# Patient Record
Sex: Female | Born: 1990 | Race: Black or African American | Hispanic: No | Marital: Single | Smoking: Never smoker
Health system: Southern US, Community
[De-identification: ages and names within clinical notes are randomized; demographics above are authoritative.]

## PROBLEM LIST (undated history)

## (undated) DIAGNOSIS — G4730 Sleep apnea, unspecified: Secondary | ICD-10-CM

## (undated) DIAGNOSIS — M109 Gout, unspecified: Secondary | ICD-10-CM

## (undated) DIAGNOSIS — J45909 Unspecified asthma, uncomplicated: Secondary | ICD-10-CM

## (undated) HISTORY — PX: CYST EXCISION: SHX5701

## (undated) HISTORY — PX: TONSILLECTOMY: SUR1361

---

## 2019-01-26 DIAGNOSIS — J45909 Unspecified asthma, uncomplicated: Secondary | ICD-10-CM | POA: Insufficient documentation

## 2020-08-10 ENCOUNTER — Other Ambulatory Visit: Payer: Self-pay

## 2020-08-10 ENCOUNTER — Encounter (HOSPITAL_COMMUNITY): Payer: Self-pay | Admitting: Emergency Medicine

## 2020-08-10 DIAGNOSIS — N3000 Acute cystitis without hematuria: Secondary | ICD-10-CM | POA: Diagnosis not present

## 2020-08-10 DIAGNOSIS — R10819 Abdominal tenderness, unspecified site: Secondary | ICD-10-CM | POA: Insufficient documentation

## 2020-08-10 DIAGNOSIS — M545 Low back pain: Secondary | ICD-10-CM | POA: Diagnosis present

## 2020-08-10 DIAGNOSIS — J45909 Unspecified asthma, uncomplicated: Secondary | ICD-10-CM | POA: Diagnosis not present

## 2020-08-10 LAB — URINALYSIS, ROUTINE W REFLEX MICROSCOPIC
Bilirubin Urine: NEGATIVE
Glucose, UA: NEGATIVE mg/dL
Ketones, ur: NEGATIVE mg/dL
Nitrite: NEGATIVE
Protein, ur: NEGATIVE mg/dL
Specific Gravity, Urine: 1.034 — ABNORMAL HIGH (ref 1.005–1.030)
pH: 5 (ref 5.0–8.0)

## 2020-08-10 LAB — CBC
HCT: 37.6 % (ref 36.0–46.0)
Hemoglobin: 12.1 g/dL (ref 12.0–15.0)
MCH: 28.5 pg (ref 26.0–34.0)
MCHC: 32.2 g/dL (ref 30.0–36.0)
MCV: 88.7 fL (ref 80.0–100.0)
Platelets: 321 10*3/uL (ref 150–400)
RBC: 4.24 MIL/uL (ref 3.87–5.11)
RDW: 14.6 % (ref 11.5–15.5)
WBC: 8.9 10*3/uL (ref 4.0–10.5)
nRBC: 0 % (ref 0.0–0.2)

## 2020-08-10 LAB — HCG, QUANTITATIVE, PREGNANCY: hCG, Beta Chain, Quant, S: <1 m[IU]/mL (ref <5)

## 2020-08-10 LAB — BASIC METABOLIC PANEL
Anion gap: 9 (ref 5–15)
BUN: 15 mg/dL (ref 6–20)
CO2: 23 mmol/L (ref 22–32)
Calcium: 8.7 mg/dL — ABNORMAL LOW (ref 8.9–10.3)
Chloride: 107 mmol/L (ref 98–111)
Creatinine, Ser: 0.71 mg/dL (ref 0.44–1.00)
GFR calc Af Amer: >60 mL/min (ref >60)
GFR calc non Af Amer: >60 mL/min (ref >60)
Glucose, Bld: 113 mg/dL — ABNORMAL HIGH (ref 70–99)
Potassium: 4.2 mmol/L (ref 3.5–5.1)
Sodium: 139 mmol/L (ref 135–145)

## 2020-08-10 NOTE — ED Triage Notes (Signed)
Patient is complaining of back pain but hurt worse on the lower part of her back. Patient states this started on Sunday.

## 2020-08-11 ENCOUNTER — Emergency Department (HOSPITAL_COMMUNITY)
Admission: EM | Admit: 2020-08-11 | Discharge: 2020-08-11 | Disposition: A | Discharge status: Home / Self Care | Payer: Medicaid Other | Attending: Emergency Medicine | Admitting: Emergency Medicine

## 2020-08-11 DIAGNOSIS — N3000 Acute cystitis without hematuria: Secondary | ICD-10-CM

## 2020-08-11 HISTORY — DX: Sleep apnea, unspecified: G47.30

## 2020-08-11 HISTORY — DX: Gout, unspecified: M10.9

## 2020-08-11 HISTORY — DX: Unspecified asthma, uncomplicated: J45.909

## 2020-08-11 LAB — HEPATIC FUNCTION PANEL
ALT: 19 U/L (ref 0–44)
AST: 24 U/L (ref 15–41)
Albumin: 3.9 g/dL (ref 3.5–5.0)
Alkaline Phosphatase: 71 U/L (ref 38–126)
Bilirubin, Direct: 0.1 mg/dL (ref 0.0–0.2)
Indirect Bilirubin: 0 mg/dL — ABNORMAL LOW (ref 0.3–0.9)
Total Bilirubin: 0.1 mg/dL — ABNORMAL LOW (ref 0.3–1.2)
Total Protein: 7 g/dL (ref 6.5–8.1)

## 2020-08-11 LAB — CK: Total CK: 105 U/L (ref 38–234)

## 2020-08-11 LAB — LIPASE, BLOOD: Lipase: 28 U/L (ref 11–51)

## 2020-08-11 MED ORDER — MORPHINE SULFATE (PF) 4 MG/ML IV SOLN
4.0000 mg | Freq: Once | INTRAVENOUS | Status: AC
  Administered 2020-08-11: 4 mg via INTRAVENOUS
  Filled 2020-08-11: qty 1

## 2020-08-11 MED ORDER — ONDANSETRON HCL 4 MG/2ML IJ SOLN
4.0000 mg | Freq: Once | INTRAMUSCULAR | Status: AC
  Administered 2020-08-11: 4 mg via INTRAVENOUS
  Filled 2020-08-11: qty 2

## 2020-08-11 MED ORDER — PHENAZOPYRIDINE HCL 200 MG PO TABS
200.0000 mg | ORAL_TABLET | Freq: Three times a day (TID) | ORAL | 0 refills | Status: DC
Start: 2020-08-11 — End: 2021-04-04

## 2020-08-11 MED ORDER — CEPHALEXIN 500 MG PO CAPS: 500.0000 mg | ORAL_CAPSULE | Freq: Four times a day (QID) | ORAL | 0 refills | Status: AC

## 2020-08-11 NOTE — ED Provider Notes (Signed)
[facility] DEPT Provider Note   CSN: [id number] Arrival date & time: 08/10/20  2113     History Chief Complaint  Patient presents with  . Back Pain    Carly Perez is a 30 y.o. female with a history of gout, OSA, uterine fibroids, and asthma who presents to the emergency department with a chief complaint of back pain.  The patient endorses bilateral back pain that began 4 days ago.  States that the pain is most intense in her bilateral lower back, but radiates up her bilateral back into her shoulders and around her bilateral lower abdomen.  Pain is worse with standing.  There is some improvement with sitting.  She is unable to characterize the pain.  No history of similar.  She has been treating her symptoms with NSAIDs and muscle relaxers with no improvement in her symptoms.  No recent falls or injuries.  No fever, chills, vomiting, diarrhea, constipation, dysuria, hematuria, cough, shortness of breath, chest pain, vaginal discharge, pain, or bleeding.  LMP was last week.  She is sexually active with one female partner, her wife.  No concerns for STIs.  No history of cancer.  No IV drug use.  No urinary or fecal incontinence, numbness, weakness.  No difficulty walking.  The history is provided by the patient. No language interpreter was used.       Past Medical History:  Diagnosis Date  . Asthma   . Gout   . Sleep apnea     There are no problems to display for this patient.   History reviewed. No pertinent surgical history.   OB History   No obstetric history on file.     History reviewed. No pertinent family history.  Social History   Tobacco Use  . Smoking status: Never Smoker  . Smokeless tobacco: Never Used  Vaping Use  . Vaping Use: Never used  Substance Use Topics  . Alcohol use: Never  . Drug use: Never    Home Medications Prior to Admission medications   Medication Sig Start Date End Date Taking? Authorizing  Provider  cephALEXin (KEFLEX) 500 MG capsule Take 1 capsule (500 mg total) by mouth 4 (four) times daily for 5 days. 08/11/20 08/16/20  Shravan Salahuddin A, PA-C  phenazopyridine (PYRIDIUM) 200 MG tablet Take 1 tablet (200 mg total) by mouth 3 (three) times daily. 08/11/20   Amalee Olsen A, PA-C    Allergies    Dog epithelium allergy skin test, Other, and Shellfish-derived products  Review of Systems   Review of Systems  Constitutional: Negative for activity change, chills and fever.  HENT: Negative for congestion and sore throat.   Respiratory: Negative for cough, shortness of breath and wheezing.   Cardiovascular: Negative for chest pain, palpitations and leg swelling.  Gastrointestinal: Positive for abdominal pain and nausea. Negative for blood in stool, constipation, diarrhea and vomiting.  Genitourinary: Negative for difficulty urinating, dysuria, enuresis, flank pain, frequency, hematuria, pelvic pain and urgency.  Musculoskeletal: Positive for back pain. Negative for myalgias, neck pain and neck stiffness.  Skin: Negative for rash.  Allergic/Immunologic: Negative for immunocompromised state.  Neurological: Negative for dizziness, seizures, syncope, weakness, numbness and headaches.  Psychiatric/Behavioral: Negative for confusion.    Physical Exam Updated Vital Signs BP 114/83   Pulse 73   Temp 98.4 F (36.9 C) (Oral)   Resp 17   Ht 5\' 3"  (1.6 m)   Wt 104.3 kg   SpO2 96%   BMI 40.74 kg/m  Physical Exam Vitals and nursing note reviewed.  Constitutional:      General: She is not in acute distress.    Appearance: She is not ill-appearing, toxic-appearing or diaphoretic.  HENT:     Head: Normocephalic.  Eyes:     Conjunctiva/sclera: Conjunctivae normal.  Neck:     Comments: Full active and passive range of motion of the neck.  No meningismus. Cardiovascular:     Rate and Rhythm: Normal rate and regular rhythm.     Heart sounds: No murmur heard.  No friction rub. No  gallop.   Pulmonary:     Effort: Pulmonary effort is normal. No respiratory distress.     Breath sounds: No stridor. No wheezing, rhonchi or rales.     Comments: Lungs are clear to auscultation bilaterally.  No increased work of breathing. Chest:     Chest wall: No tenderness.  Abdominal:     General: There is no distension.     Palpations: Abdomen is soft. There is no mass.     Tenderness: There is abdominal tenderness. There is no right CVA tenderness, left CVA tenderness, guarding or rebound.     Hernia: No hernia is present.     Comments: Abdomen is soft and nondistended.  She is tender to palpation in the bilateral lower abdomen and right upper quadrant.  No left upper quadrant tenderness.  Negative Murphy sign.  No tenderness over McBurney's point.  Musculoskeletal:     Cervical back: Neck supple.     Comments: No tenderness to the cervical, thoracic, or lumbar spinous processes or bilateral paraspinal muscles.  There is tenderness diffusely to the musculature of the lumbar spine bilaterally.  No overlying rashes, erythema, or edema.  Skin:    General: Skin is warm.     Findings: No rash.  Neurological:     Mental Status: She is alert.  Psychiatric:        Behavior: Behavior normal.     ED Results / Procedures / Treatments   Labs (all labs ordered are listed, but only abnormal results are displayed) Labs Reviewed  URINALYSIS, ROUTINE W REFLEX MICROSCOPIC - Abnormal; Notable for the following components:      Result Value   Specific Gravity, Urine 1.034 (*)    Hgb urine dipstick MODERATE (*)    Leukocytes,Ua SMALL (*)    Bacteria, UA RARE (*)    All other components within normal limits  BASIC METABOLIC PANEL - Abnormal; Notable for the following components:   Glucose, Bld 113 (*)    Calcium 8.7 (*)    All other components within normal limits  HEPATIC FUNCTION PANEL - Abnormal; Notable for the following components:   Total Bilirubin 0.1 (*)    Indirect Bilirubin 0.0  (*)    All other components within normal limits  URINE CULTURE  HCG, QUANTITATIVE, PREGNANCY  CBC  LIPASE, BLOOD  CK    EKG None  Radiology No results found.  Procedures Procedures (including critical care time)  Medications Ordered in ED Medications  morphine 4 MG/ML injection 4 mg (4 mg Intravenous Given 08/11/20 0317)  ondansetron (ZOFRAN) injection 4 mg (4 mg Intravenous Given 08/11/20 2671)    ED Course  I have reviewed the triage vital signs and the nursing notes.  Pertinent labs & imaging results that were available during my care of the patient were reviewed by me and considered in my medical decision making (see chart for details).    MDM Rules/Calculators/A&P  29 year old female with a history of gout, OSA, uterine fibroids, and asthma presenting with lower abdominal and back pain for the last few days.  No constitutional symptoms.  No GI or GU symptoms.  Vital signs are normal in the ER.  On exam, she is tender to palpation in the bilateral lower abdomen and back.  No focal tenderness over the spine.  She has no neurologic deficits.  Pain managed in the ER.  She has no metabolic derangements.  No leukocytosis.  UA with moderate hemoglobinuria and leukocyte Esterace.  Urine culture sent.  Has no concerns for pregnancy or GU complaints at this time and declines pelvic exam.  Her abdomen is soft and nondistended.  She does not have a surgical abdomen.  Further imaging is not indicated at this time.  On reevaluation, her pain is much improved.  Will discharge home with Keflex and Pyridium for UTI.  Recommended OTC pain medication.  She is also been advised to get established with a PCP since she recently relocated to the area.  I have a low suspicion for pyelonephritis, diverticulitis, PID, cholecystitis, ovarian torsion, or appendicitis.  All questions answered. She is hemodynamically stable in no acute distress.  Safe for discharge home with  outpatient follow-up as indicated.  Final Clinical Impression(s) / ED Diagnoses Final diagnoses:  Acute cystitis without hematuria    Rx / DC Orders ED Discharge Orders         Ordered    phenazopyridine (PYRIDIUM) 200 MG tablet  3 times daily     Discontinue  Reprint     08/11/20 0440    cephALEXin (KEFLEX) 500 MG capsule  4 times daily     Discontinue  Reprint     08/11/20 0440           Joanne Gavel, PA-C 08/12/20 9233    Margette Fast, MD 08/12/20 [phone]

## 2020-08-11 NOTE — Discharge Instructions (Signed)
Thank you for allowing me to care for you today in the Emergency Department.   You were seen in the emergency department for back and abdominal pain.  Your work-up was concerning for a urinary tract infection.  Take 1 tablet of Keflex every 6 hours for the next 5 days.  Take Pyridium as prescribed to help with your symptoms. Take 650 mg of Tylenol or 600 mg of ibuprofen with food every 6 hours for pain.  You can alternate between these 2 medications every 3 hours if your pain returns.  For instance, you can take Tylenol at noon, followed by a dose of ibuprofen at 3, followed by second dose of Tylenol and 6.  Call the number on your discharge paperwork to get established with a primary care clinician for follow-up if your symptoms do not improve with the regimen above.  Return to the emergency department if you become unable to walk, if you develop high fevers, uncontrollable vomiting, new numbness or weakness, or other new, concerning symptoms.

## 2020-08-12 LAB — URINE CULTURE: Culture: 40000 — AB

## 2020-08-14 ENCOUNTER — Telehealth: Payer: Self-pay | Admitting: Emergency Medicine

## 2020-08-14 NOTE — Telephone Encounter (Signed)
Post ED Visit - Positive Culture Follow-up  Culture report reviewed by antimicrobial stewardship pharmacist: [facility] Team []  Elenor Quinones, Pharm.D. []  Heide Guile, Pharm.D., BCPS AQ-ID []  Parks Neptune, Pharm.D., BCPS []  Alycia Rossetti, Pharm.D., BCPS []  [place], Pharm.D., BCPS, AAHIVP []  Legrand Como, Pharm.D., BCPS, AAHIVP []  Salome Arnt, PharmD, BCPS []  Johnnette Gourd, PharmD, BCPS []  Hughes Better, PharmD, BCPS []  Leeroy Cha, PharmD []  Laqueta Linden, PharmD, BCPS []  Albertina Parr, PharmD  [facility] Team [x]  Leodis Sias, PharmD []  Lindell Spar, PharmD []  Royetta Asal, PharmD []  Graylin Shiver, Rph []  Rema Fendt) Glennon Mac, PharmD []  Arlyn Dunning, PharmD []  Netta Cedars, PharmD []  Dia Sitter, PharmD []  Leone Haven, PharmD []  Gretta Arab, PharmD []  Theodis Shove, PharmD []  Peggyann Juba, PharmD []  Reuel Boom, PharmD   Positive urine culture Treated with Cephalexin, organism sensitive to the same and no further patient follow-up is required at this time.  Sandi Raveling Shariya Gaster 08/14/2020, 2:59 PM

## 2020-10-22 ENCOUNTER — Encounter (HOSPITAL_COMMUNITY): Payer: Self-pay | Admitting: Emergency Medicine

## 2020-10-22 ENCOUNTER — Emergency Department (HOSPITAL_COMMUNITY)
Admission: EM | Admit: 2020-10-22 | Discharge: 2020-10-22 | Disposition: A | Discharge status: Home / Self Care | Payer: Medicaid Other | Attending: Emergency Medicine | Admitting: Emergency Medicine

## 2020-10-22 ENCOUNTER — Other Ambulatory Visit: Payer: Self-pay

## 2020-10-22 DIAGNOSIS — J45909 Unspecified asthma, uncomplicated: Secondary | ICD-10-CM | POA: Insufficient documentation

## 2020-10-22 DIAGNOSIS — T2112XA Burn of first degree of abdominal wall, initial encounter: Secondary | ICD-10-CM | POA: Diagnosis not present

## 2020-10-22 DIAGNOSIS — T300 Burn of unspecified body region, unspecified degree: Secondary | ICD-10-CM

## 2020-10-22 DIAGNOSIS — Y9289 Other specified places as the place of occurrence of the external cause: Secondary | ICD-10-CM | POA: Diagnosis not present

## 2020-10-22 DIAGNOSIS — X12XXXA Contact with other hot fluids, initial encounter: Secondary | ICD-10-CM | POA: Insufficient documentation

## 2020-10-22 MED ORDER — BACITRACIN ZINC 500 UNIT/GM EX OINT
TOPICAL_OINTMENT | Freq: Two times a day (BID) | CUTANEOUS | Status: DC
  Administered 2020-10-22: 3 via TOPICAL
  Filled 2020-10-22: qty 2.7
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
## 2020-10-22 NOTE — Discharge Instructions (Signed)
Apply bacitracin to burn and cover if you are going to be outside of the home. Try and allow for airtime if at home. Motrin and Tylenol as needed as directed for pain.
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
## 2020-11-29 ENCOUNTER — Encounter (HOSPITAL_COMMUNITY): Payer: Self-pay

## 2020-11-29 ENCOUNTER — Emergency Department (HOSPITAL_COMMUNITY): Payer: Medicaid Other

## 2020-11-29 ENCOUNTER — Emergency Department (HOSPITAL_COMMUNITY)
Admission: EM | Admit: 2020-11-29 | Discharge: 2020-11-29 | Disposition: A | Discharge status: Home / Self Care | Payer: Medicaid Other | Attending: Emergency Medicine | Admitting: Emergency Medicine

## 2020-11-29 ENCOUNTER — Other Ambulatory Visit: Payer: Self-pay

## 2020-11-29 DIAGNOSIS — R1013 Epigastric pain: Secondary | ICD-10-CM | POA: Diagnosis not present

## 2020-11-29 DIAGNOSIS — R110 Nausea: Secondary | ICD-10-CM | POA: Diagnosis not present

## 2020-11-29 DIAGNOSIS — G43909 Migraine, unspecified, not intractable, without status migrainosus: Secondary | ICD-10-CM | POA: Diagnosis not present

## 2020-11-29 DIAGNOSIS — J45909 Unspecified asthma, uncomplicated: Secondary | ICD-10-CM | POA: Diagnosis not present

## 2020-11-29 DIAGNOSIS — R519 Headache, unspecified: Secondary | ICD-10-CM | POA: Diagnosis present

## 2020-11-29 DIAGNOSIS — G43009 Migraine without aura, not intractable, without status migrainosus: Secondary | ICD-10-CM

## 2020-11-29 LAB — COMPREHENSIVE METABOLIC PANEL
ALT: 14 U/L (ref 0–44)
AST: 21 U/L (ref 15–41)
Albumin: 4 g/dL (ref 3.5–5.0)
Alkaline Phosphatase: 52 U/L (ref 38–126)
Anion gap: 9 (ref 5–15)
BUN: 14 mg/dL (ref 6–20)
CO2: 20 mmol/L — ABNORMAL LOW (ref 22–32)
Calcium: 9 mg/dL (ref 8.9–10.3)
Chloride: 107 mmol/L (ref 98–111)
Creatinine, Ser: 0.76 mg/dL (ref 0.44–1.00)
GFR, Estimated: >60 mL/min (ref >60)
Glucose, Bld: 90 mg/dL (ref 70–99)
Potassium: 4 mmol/L (ref 3.5–5.1)
Sodium: 136 mmol/L (ref 135–145)
Total Bilirubin: 0.2 mg/dL — ABNORMAL LOW (ref 0.3–1.2)
Total Protein: 7.2 g/dL (ref 6.5–8.1)

## 2020-11-29 LAB — CBC
HCT: 44.3 % (ref 36.0–46.0)
Hemoglobin: 13.2 g/dL (ref 12.0–15.0)
MCH: 28.5 pg (ref 26.0–34.0)
MCHC: 29.8 g/dL — ABNORMAL LOW (ref 30.0–36.0)
MCV: 95.7 fL (ref 80.0–100.0)
Platelets: 255 10*3/uL (ref 150–400)
RBC: 4.63 MIL/uL (ref 3.87–5.11)
RDW: 14.6 % (ref 11.5–15.5)
WBC: 7.8 10*3/uL (ref 4.0–10.5)
nRBC: 0 % (ref 0.0–0.2)

## 2020-11-29 LAB — URINALYSIS, ROUTINE W REFLEX MICROSCOPIC
Bilirubin Urine: NEGATIVE
Glucose, UA: NEGATIVE mg/dL
Hgb urine dipstick: NEGATIVE
Ketones, ur: NEGATIVE mg/dL
Leukocytes,Ua: NEGATIVE
Nitrite: NEGATIVE
Protein, ur: NEGATIVE mg/dL
Specific Gravity, Urine: 1.031 — ABNORMAL HIGH (ref 1.005–1.030)
pH: 5 (ref 5.0–8.0)

## 2020-11-29 LAB — LIPASE, BLOOD: Lipase: 30 U/L (ref 11–51)

## 2020-11-29 LAB — HCG, QUANTITATIVE, PREGNANCY: hCG, Beta Chain, Quant, S: 1 m[IU]/mL (ref <5)

## 2020-11-29 MED ORDER — PANTOPRAZOLE SODIUM 40 MG PO TBEC
40.0000 mg | DELAYED_RELEASE_TABLET | Freq: Every day | ORAL | 0 refills | Status: DC
Start: 2020-11-29 — End: 2022-12-28

## 2020-11-29 MED ORDER — LIDOCAINE VISCOUS HCL 2 % MT SOLN
15.0000 mL | Freq: Once | OROMUCOSAL | Status: AC
  Administered 2020-11-29: 15 mL via ORAL
  Filled 2020-11-29: qty 15

## 2020-11-29 MED ORDER — DIPHENHYDRAMINE HCL 50 MG/ML IJ SOLN
25.0000 mg | Freq: Once | INTRAMUSCULAR | Status: AC
  Administered 2020-11-29: 25 mg via INTRAVENOUS
  Filled 2020-11-29: qty 1

## 2020-11-29 MED ORDER — PROCHLORPERAZINE EDISYLATE 10 MG/2ML IJ SOLN
10.0000 mg | Freq: Once | INTRAMUSCULAR | Status: AC
  Administered 2020-11-29: 10 mg via INTRAVENOUS
  Filled 2020-11-29: qty 2

## 2020-11-29 MED ORDER — DEXAMETHASONE SODIUM PHOSPHATE 10 MG/ML IJ SOLN
10.0000 mg | Freq: Once | INTRAMUSCULAR | Status: AC
  Administered 2020-11-29: 10 mg via INTRAVENOUS
  Filled 2020-11-29: qty 1

## 2020-11-29 MED ORDER — DICYCLOMINE HCL 10 MG/5ML PO SOLN
10.0000 mg | Freq: Once | ORAL | Status: AC
  Administered 2020-11-29: 10 mg via ORAL
  Filled 2020-11-29: qty 5

## 2020-11-29 MED ORDER — LACTATED RINGERS IV BOLUS
1000.0000 mL | Freq: Once | INTRAVENOUS | Status: AC
  Administered 2020-11-29: 1000 mL via INTRAVENOUS

## 2020-11-29 MED ORDER — ALUM & MAG HYDROXIDE-SIMETH 200-200-20 MG/5ML PO SUSP
30.0000 mL | Freq: Once | ORAL | Status: AC
  Administered 2020-11-29: 30 mL via ORAL
  Filled 2020-11-29: qty 30

## 2020-11-29 NOTE — ED Provider Notes (Signed)
[facility] DEPT Provider Note   CSN: [id number] Arrival date & time: 11/29/20  0024    History Chief Complaint  Patient presents with  . Abdominal Pain  . Headache    Carly Perez is a 29 y.o. female.  The history is provided by the patient.  Abdominal Pain Headache Associated symptoms: abdominal pain   She states that she has a history of migraine headaches, but ran out of the medication she uses to treat them.  She complains of a bifrontal headache which has been present for the last several weeks.  Headache is constant and does occasionally radiate to the occiput.  It is throbbing and worse with exposure to light.  There is associated nausea but no vomiting.  She denies blurred vision and denies any weakness or numbness.  Headache is typical of her migraines.  Pain is rated at 10/10.  As a separate complaint, she has had some epigastric pain since yesterday morning.  Pain is sharp and does radiate to the back.  There has been associated nausea but no vomiting.  Nothing makes his pain better, nothing makes it worse.  She has tried taking Pepto-Bismol without relief.  She denies eating anything unusual prior to onset of pain.  Past Medical History:  Diagnosis Date  . Asthma   . Gout   . Sleep apnea     There are no problems to display for this patient.   History reviewed. No pertinent surgical history.   OB History   No obstetric history on file.     No family history on file.  Social History   Tobacco Use  . Smoking status: Never Smoker  . Smokeless tobacco: Never Used  Vaping Use  . Vaping Use: Never used  Substance Use Topics  . Alcohol use: Never  . Drug use: Never    Home Medications Prior to Admission medications   Medication Sig Start Date End Date Taking? Authorizing Provider  albuterol (PROVENTIL) (2.5 MG/3ML) 0.083% nebulizer solution albuterol sulfate 2.5 mg/3 mL (0.083 %) solution for nebulization   Yes  [provider]  escitalopram (LEXAPRO) 20 MG tablet escitalopram 20 mg tablet   Yes [provider]  rizatriptan (MAXALT-MLT) 5 MG disintegrating tablet rizatriptan 5 mg disintegrating tablet   Yes [provider]  phenazopyridine (PYRIDIUM) 200 MG tablet Take 1 tablet (200 mg total) by mouth 3 (three) times daily. Patient not taking: Reported on 11/29/2020 08/11/20   McDonald, Maree Erie A, PA-C    Allergies    Dog epithelium allergy skin test, Other, and Shellfish-derived products  Review of Systems   Review of Systems  Gastrointestinal: Positive for abdominal pain.  Neurological: Positive for headaches.  All other systems reviewed and are negative.   Physical Exam Updated Vital Signs BP (!) 103/91 (BP Location: Left Arm)   Pulse 76   Temp 97.7 F (36.5 C) (Oral)   Resp 18   LMP 11/22/2020   SpO2 98%   Physical Exam Vitals and nursing note reviewed.   29 year old female, resting comfortably and in no acute distress. Vital signs are normal. Oxygen saturation is 98%, which is normal. Head is normocephalic and atraumatic. PERRLA, EOMI. Oropharynx is clear.  Photophobia is present. Neck is nontender and supple without adenopathy or JVD. Back is nontender and there is no CVA tenderness. Lungs are clear without rales, wheezes, or rhonchi. Chest is nontender. Heart has regular rate and rhythm without murmur. Abdomen is soft, flat, with mild  tenderness in the epigastrium and right upper quadrant.  There is no rebound or guarding.  Murphy sign is negative.  There are no masses or hepatosplenomegaly and peristalsis is normoactive. Extremities have no cyanosis or edema, full range of motion is present. Skin is warm and dry without rash. Neurologic: Mental status is normal, cranial nerves are intact, there are no motor or sensory deficits.  ED Results / Procedures / Treatments   Labs (all labs ordered are listed, but only abnormal results are displayed) Labs  Reviewed  COMPREHENSIVE METABOLIC PANEL - Abnormal; Notable for the following components:      Result Value   CO2 20 (*)    Total Bilirubin 0.2 (*)    All other components within normal limits  CBC - Abnormal; Notable for the following components:   MCHC 29.8 (*)    All other components within normal limits  URINALYSIS, ROUTINE W REFLEX MICROSCOPIC - Abnormal; Notable for the following components:   Specific Gravity, Urine 1.031 (*)    All other components within normal limits  LIPASE, BLOOD  HCG, QUANTITATIVE, PREGNANCY   Procedures Procedures   Imaging results US Abdomen Limited  Result Date: 11/29/2020 CLINICAL DATA:  Upper abdominal pain EXAM: ULTRASOUND ABDOMEN LIMITED RIGHT UPPER QUADRANT COMPARISON:  None. FINDINGS: Gallbladder: No gallstones or wall thickening visualized. No sonographic Murphy sign noted by sonographer. Common bile duct: Diameter: 3 mm Liver: No focal lesion identified. Within normal limits in parenchymal echogenicity. Portal vein is patent on color Doppler imaging with normal direction of blood flow towards the liver. IMPRESSION: Normal right upper quadrant ultrasound. Electronically Signed   By: Monte Fantasia M.D.   On: 11/29/2020 07:10     Medications Ordered in ED Medications  lactated ringers bolus 1,000 mL (has no administration in time range)  prochlorperazine (COMPAZINE) injection 10 mg (has no administration in time range)  diphenhydrAMINE (BENADRYL) injection 25 mg (has no administration in time range)  dexamethasone (DECADRON) injection 10 mg (has no administration in time range)  dicyclomine (BENTYL) 10 MG/5ML solution 10 mg (has no administration in time range)  alum & mag hydroxide-simeth (MAALOX/MYLANTA) 200-200-20 MG/5ML suspension 30 mL (has no administration in time range)    And  lidocaine (XYLOCAINE) 2 % viscous mouth solution 15 mL (has no administration in time range)    ED Course  I have reviewed the triage vital signs and the  nursing notes.  Pertinent lab results that were available during my care of the patient were reviewed by me and considered in my medical decision making (see chart for details).  MDM Rules/Calculators/A&P Migraine headache.  No red flags to suggest more serious pathology such as meningitis, subarachnoid hemorrhage, tumor, intracranial hypertension.  Epigastric pain of uncertain cause, suspect gastritis versus peptic ulcer disease.  Consider pancreatitis, diverticulitis.  Labs are unremarkable including normal liver function tests and normal lipase.  Old records are reviewed, and she has no relevant past visits.  She is given a migraine cocktail of lactated Ringer's, prochlorperazine, diphenhydramine, dexamethasone.  For abdominal distress, she is given a dose of GI cocktail.  She is also being sent for right upper quadrant ultrasound.    Ultrasound is unremarkable.  Case is signed out to Dr. Verner Chol to evaluate response to treatment.  Final Clinical Impression(s) / ED Diagnoses Final diagnoses:  Migraine without aura and without status migrainosus, not intractable  Epigastric pain   Rx / DC Orders ED Discharge Orders    None  Delora Fuel, MD 87/27/61 [phone]

## 2020-11-29 NOTE — ED Provider Notes (Signed)
Care transferred to me.  Patient is feeling much better.  Will DC with PPI and follow-up with PCP.   Sherwood Gambler, MD 11/29/20 [phone]

## 2020-11-29 NOTE — ED Triage Notes (Signed)
Patient arrived with complaints of headache and central abdominal pain/nausea over the last couple weeks. Patient states she has been taking her migraine medication but ran out.

## 2020-11-29 NOTE — Discharge Instructions (Signed)
If you develop continued, recurrent, or worsening headache, fever, neck stiffness, vomiting, blurry or double vision, weakness or numbness in your arms or legs, trouble speaking, or any other new/concerning symptoms then return to the ER for evaluation.  

## 2021-04-04 ENCOUNTER — Other Ambulatory Visit: Payer: Self-pay

## 2021-04-04 ENCOUNTER — Encounter (HOSPITAL_COMMUNITY): Payer: Self-pay

## 2021-04-04 ENCOUNTER — Emergency Department (HOSPITAL_COMMUNITY): Payer: Medicaid Other

## 2021-04-04 ENCOUNTER — Emergency Department (HOSPITAL_COMMUNITY)
Admission: EM | Admit: 2021-04-04 | Discharge: 2021-04-04 | Disposition: A | Discharge status: Home / Self Care | Payer: Medicaid Other | Attending: Emergency Medicine | Admitting: Emergency Medicine

## 2021-04-04 DIAGNOSIS — S40012A Contusion of left shoulder, initial encounter: Secondary | ICD-10-CM

## 2021-04-04 DIAGNOSIS — Y9289 Other specified places as the place of occurrence of the external cause: Secondary | ICD-10-CM | POA: Diagnosis not present

## 2021-04-04 DIAGNOSIS — S199XXA Unspecified injury of neck, initial encounter: Secondary | ICD-10-CM | POA: Diagnosis present

## 2021-04-04 DIAGNOSIS — S134XXA Sprain of ligaments of cervical spine, initial encounter: Secondary | ICD-10-CM | POA: Diagnosis not present

## 2021-04-04 DIAGNOSIS — S139XXA Sprain of joints and ligaments of unspecified parts of neck, initial encounter: Secondary | ICD-10-CM

## 2021-04-04 DIAGNOSIS — W01198A Fall on same level from slipping, tripping and stumbling with subsequent striking against other object, initial encounter: Secondary | ICD-10-CM | POA: Insufficient documentation

## 2021-04-04 DIAGNOSIS — M549 Dorsalgia, unspecified: Secondary | ICD-10-CM | POA: Insufficient documentation

## 2021-04-04 DIAGNOSIS — J45909 Unspecified asthma, uncomplicated: Secondary | ICD-10-CM | POA: Diagnosis not present

## 2021-04-04 DIAGNOSIS — W010XXA Fall on same level from slipping, tripping and stumbling without subsequent striking against object, initial encounter: Secondary | ICD-10-CM

## 2021-04-04 LAB — POC URINE PREG, ED: Preg Test, Ur: NEGATIVE

## 2021-04-04 MED ORDER — TRAMADOL HCL 50 MG PO TABS
50.0000 mg | ORAL_TABLET | Freq: Once | ORAL | Status: AC
Start: 2021-04-04 — End: 2021-04-04
  Administered 2021-04-04: 50 mg via ORAL
  Filled 2021-04-04: qty 1

## 2021-04-04 MED ORDER — CYCLOBENZAPRINE HCL 10 MG PO TABS
10.0000 mg | ORAL_TABLET | Freq: Once | ORAL | Status: AC
  Administered 2021-04-04: 10 mg via ORAL
  Filled 2021-04-04: qty 1

## 2021-04-04 MED ORDER — TRAMADOL HCL 50 MG PO TABS: 50.0000 mg | ORAL_TABLET | Freq: Four times a day (QID) | ORAL | 0 refills | Status: DC | PRN

## 2021-04-04 MED ORDER — CYCLOBENZAPRINE HCL 10 MG PO TABS: 10.0000 mg | ORAL_TABLET | Freq: Three times a day (TID) | ORAL | 0 refills | Status: DC | PRN

## 2021-04-04 NOTE — ED Provider Notes (Signed)
[facility] DEPT Provider Note   CSN: [id number] Arrival date & time: 04/04/21  0022   History Chief Complaint  Patient presents with  . Fall    Carly Perez is a 30 y.o. female.  The history is provided by the patient.  Fall  She has history of asthma, gout and comes in complaining of pain following a fall at work.  4 days ago, she slipped on a wet floor and fell landing on her left side.  She is complaining of pain in her neck, back, left shoulder.  She has been taking ibuprofen and acetaminophen without relief.  Pain is rated at 10/10.  She denies any weakness, numbness, tingling.  She denies any head injury, and denies loss of consciousness.  She denies any bowel or bladder dysfunction.  Past Medical History:  Diagnosis Date  . Asthma   . Gout   . Sleep apnea     There are no problems to display for this patient.   History reviewed. No pertinent surgical history.   OB History   No obstetric history on file.     No family history on file.  Social History   Tobacco Use  . Smoking status: Never Smoker  . Smokeless tobacco: Never Used  Vaping Use  . Vaping Use: Never used  Substance Use Topics  . Alcohol use: Never  . Drug use: Never    Home Medications Prior to Admission medications   Medication Sig Start Date End Date Taking? Authorizing Provider  cyclobenzaprine (FLEXERIL) 10 MG tablet Take 1 tablet (10 mg total) by mouth 3 (three) times daily as needed for muscle spasms. 06/04/39  Yes Delora Fuel, MD  traMADol (ULTRAM) 50 MG tablet Take 1 tablet (50 mg total) by mouth every 6 (six) hours as needed. 03/03/73  Yes Delora Fuel, MD  albuterol (PROVENTIL) (2.5 MG/3ML) 0.083% nebulizer solution albuterol sulfate 2.5 mg/3 mL (0.083 %) solution for nebulization    [provider]  escitalopram (LEXAPRO) 20 MG tablet escitalopram 20 mg tablet    [provider]  pantoprazole (PROTONIX) 40 MG tablet Take 1  tablet (40 mg total) by mouth daily. 11/29/20   Sherwood Gambler, MD  rizatriptan (MAXALT-MLT) 5 MG disintegrating tablet rizatriptan 5 mg disintegrating tablet    [provider]    Allergies    Dog epithelium allergy skin test, Other, and Shellfish-derived products  Review of Systems   Review of Systems  All other systems reviewed and are negative.   Physical Exam Updated Vital Signs BP 124/76 (BP Location: Left Arm)   Pulse 85   Temp 98.3 F (36.8 C) (Oral)   Resp 16   Ht 5\' 3"  (1.6 m)   Wt 104.3 kg   LMP 03/28/2021 Comment: neg preg test  SpO2 100%   BMI 40.74 kg/m   Physical Exam Vitals and nursing note reviewed.   30 year old female, resting comfortably and in no acute distress. Vital signs are normal. Oxygen saturation is 100%, which is normal. Head is normocephalic and atraumatic. PERRLA, EOMI. Oropharynx is clear. Neck: She is wearing a soft cervical collar.  There is no midline tenderness.  There is mild tenderness to palpation of the left paracervical muscles. Back is nontender in the midline.  There is mild to moderate tenderness of the soft tissues on the left side of the back. Lungs are clear without rales, wheezes, or rhonchi. Chest is nontender. Heart has regular rate and rhythm without murmur. Abdomen  is soft, flat, nontender without masses or hepatosplenomegaly and peristalsis is normoactive. Extremities: No tenderness is elicited.  There is pain on passive range of motion of the left shoulder, full range of motion of all other joints without pain Skin is warm and dry without rash. Neurologic: Mental status is normal, cranial nerves are intact, there are no motor or sensory deficits.  ED Results / Procedures / Treatments   Labs (all labs ordered are listed, but only abnormal results are displayed) Labs Reviewed  POC URINE PREG, ED   Radiology DG Chest 2 View  Result Date: 04/04/2021 CLINICAL DATA:  Pain status post fall. EXAM: CHEST - 2 VIEW  COMPARISON:  None. FINDINGS: The heart size and mediastinal contours are within normal limits. Both lungs are clear. The visualized skeletal structures are unremarkable. IMPRESSION: No active cardiopulmonary disease. Electronically Signed   By: Constance Holster M.D.   On: 04/04/2021 01:41   DG Thoracic Spine 2 View  Result Date: 04/04/2021 CLINICAL DATA:  Pain EXAM: THORACIC SPINE 2 VIEWS COMPARISON:  None. FINDINGS: There is no evidence of thoracic spine fracture. Alignment is normal. No other significant bone abnormalities are identified. IMPRESSION: Negative. Electronically Signed   By: Constance Holster M.D.   On: 04/04/2021 01:42   DG Lumbar Spine Complete  Result Date: 04/04/2021 CLINICAL DATA:  Pain EXAM: LUMBAR SPINE - COMPLETE 4+ VIEW COMPARISON:  None. FINDINGS: There is no evidence of lumbar spine fracture. Alignment is normal. Intervertebral disc spaces are maintained. IMPRESSION: Negative. Electronically Signed   By: Constance Holster M.D.   On: 04/04/2021 01:41   CT Cervical Spine Wo Contrast  Result Date: 04/04/2021 CLINICAL DATA:  Neck pain. EXAM: CT CERVICAL SPINE WITHOUT CONTRAST TECHNIQUE: Multidetector CT imaging of the cervical spine was performed without intravenous contrast. Multiplanar CT image reconstructions were also generated. COMPARISON:  None. FINDINGS: Alignment: There is straightening of the normal cervical lordotic curvature. Skull base and vertebrae: No acute fracture. No primary bone lesion or focal pathologic process. Soft tissues and spinal canal: No prevertebral fluid or swelling. No visible canal hematoma. Disc levels:  Normal Upper chest: Negative. Other: None. IMPRESSION: 1. Straightening of the normal cervical lordotic curvature may be due to positioning or muscle spasm. 2. No acute fracture or subluxation of the cervical spine. Electronically Signed   By: Constance Holster M.D.   On: 04/04/2021 02:44   DG Shoulder Left  Result Date: 04/04/2021 CLINICAL  DATA:  Pain status post fall EXAM: LEFT SHOULDER - 2+ VIEW COMPARISON:  None. FINDINGS: There is no evidence of fracture or dislocation. There is no evidence of arthropathy or other focal bone abnormality. Soft tissues are unremarkable. There is an os acromiale, a normal variant. IMPRESSION: Negative. Electronically Signed   By: Constance Holster M.D.   On: 04/04/2021 01:42    Procedures Procedures   Medications Ordered in ED Medications  cyclobenzaprine (FLEXERIL) tablet 10 mg (10 mg Oral Given 04/04/21 0312)  traMADol (ULTRAM) tablet 50 mg (50 mg Oral Given 04/04/21 5701)    ED Course  I have reviewed the triage vital signs and the nursing notes.  Pertinent labs & imaging results that were available during my care of the patient were reviewed by me and considered in my medical decision making (see chart for details).  MDM Rules/Calculators/A&P Fall with complaints of pain in the neck, back, left shoulder.  X-rays and CT scans have been obtained and showed no acute injury.  Old records are reviewed, and she has  no relevant past visits.  She is discharged with prescription for cyclobenzaprine and also for a small number of tramadol tablets.  Advised to apply ice and try combining ibuprofen and acetaminophen.  Final Clinical Impression(s) / ED Diagnoses Final diagnoses:  Fall from slipping, initial encounter  Cervical sprain, initial encounter  Contusion of left shoulder, initial encounter    Rx / DC Orders ED Discharge Orders         Ordered    cyclobenzaprine (FLEXERIL) 10 MG tablet  3 times daily PRN        04/04/21 0312    traMADol (ULTRAM) 50 MG tablet  Every 6 hours PRN        04/04/21 8921           Delora Fuel, MD 19/41/74 [phone]

## 2021-04-04 NOTE — ED Triage Notes (Signed)
Pt reports a fall at work hitting head, and left upper back/ shoulder on ground.

## 2021-04-04 NOTE — ED Provider Notes (Signed)
MSE was initiated and I personally evaluated the patient and placed orders (if any) at  12:41 AM on April 04, 2021.  Mechanical slip and fall @ work.  Hit head but no LOC.  Neck/back pain and L shoulder pain.  Mild headache.  No visual disturbance, seizure, or vomiting.   Heart RRR.  Lungs: breath sounds present bilaterally  Diffuse midline tenderness (C/T/L spine).  Chest wall tenderness.  L shoulder tenderness.  2+ symmetric radial pulses, grossly intact sensation to bilateral UEs, 5/5 symmetric grip strength.   Imaging ordered.   The patient appears stable so that the remainder of the MSE may be completed by another provider.   Leafy Kindle 75/88/32 5498    Delora Fuel, MD 26/41/58 [phone]

## 2021-04-04 NOTE — Discharge Instructions (Signed)
Apply ice to sore areas. Ice should be applied for thirty minutes at a time, 4 times a day.  Take ibuprofen and/or acetaminophen as needed for pain.  Please note that combining both medications gives you better pain relief than taking either one by itself.  Take the muscle relaxer 3 times a day.  Reserve tramadol for more severe pain which is not relieved by ibuprofen and acetaminophen.

## 2021-05-31 ENCOUNTER — Encounter (HOSPITAL_COMMUNITY): Payer: Self-pay | Admitting: Emergency Medicine

## 2021-05-31 ENCOUNTER — Emergency Department (HOSPITAL_COMMUNITY)
Admission: EM | Admit: 2021-05-31 | Discharge: 2021-06-01 | Disposition: A | Discharge status: Home / Self Care | Payer: Medicaid Other | Attending: Emergency Medicine | Admitting: Emergency Medicine

## 2021-05-31 ENCOUNTER — Other Ambulatory Visit: Payer: Self-pay

## 2021-05-31 DIAGNOSIS — J45909 Unspecified asthma, uncomplicated: Secondary | ICD-10-CM | POA: Diagnosis not present

## 2021-05-31 DIAGNOSIS — R21 Rash and other nonspecific skin eruption: Secondary | ICD-10-CM

## 2021-05-31 DIAGNOSIS — L03116 Cellulitis of left lower limb: Secondary | ICD-10-CM | POA: Insufficient documentation

## 2021-05-31 NOTE — ED Triage Notes (Signed)
Pt arriving with rash to both legs that she first noticed approx 3 days ago. Pt has tried hydrocortisone pills with no relief. Pt states it is very itchy and painful for clothing to touch it.

## 2021-06-01 MED ORDER — DOXYCYCLINE HYCLATE 100 MG PO CAPS: 100.0000 mg | ORAL_CAPSULE | Freq: Two times a day (BID) | ORAL | 0 refills | Status: DC

## 2021-06-01 MED ORDER — DOXYCYCLINE HYCLATE 100 MG PO TABS
100.0000 mg | ORAL_TABLET | Freq: Once | ORAL | Status: AC
  Administered 2021-06-01: 100 mg via ORAL
  Filled 2021-06-01: qty 1

## 2021-06-01 MED ORDER — DEXAMETHASONE SODIUM PHOSPHATE 10 MG/ML IJ SOLN
10.0000 mg | Freq: Once | INTRAMUSCULAR | Status: AC
  Administered 2021-06-01: 10 mg via INTRAMUSCULAR
  Filled 2021-06-01: qty 1

## 2021-06-01 NOTE — ED Provider Notes (Signed)
[facility] DEPT Provider Note   CSN: [id number] Arrival date & time: 05/31/21  2311     History Chief Complaint  Patient presents with  . Rash    legs    Carly Perez is a 30 y.o. female.  Patient presents to the emergency department for evaluation of rash on her legs.  Patient reports that she started with small itchy bumps 3 days ago.  Most of these areas have faded away but she has 1 large area on the back of her leg that is swollen and tender to the touch.        Past Medical History:  Diagnosis Date  . Asthma   . Gout   . Sleep apnea     There are no problems to display for this patient.   History reviewed. No pertinent surgical history.   OB History   No obstetric history on file.     History reviewed. No pertinent family history.  Social History   Tobacco Use  . Smoking status: Never Smoker  . Smokeless tobacco: Never Used  Vaping Use  . Vaping Use: Never used  Substance Use Topics  . Alcohol use: Never  . Drug use: Never    Home Medications Prior to Admission medications   Medication Sig Start Date End Date Taking? Authorizing Provider  albuterol (PROVENTIL) (2.5 MG/3ML) 0.083% nebulizer solution albuterol sulfate 2.5 mg/3 mL (0.083 %) solution for nebulization    [provider]  cyclobenzaprine (FLEXERIL) 10 MG tablet Take 1 tablet (10 mg total) by mouth 3 (three) times daily as needed for muscle spasms. 5/0/35   Delora Fuel, MD  escitalopram (LEXAPRO) 20 MG tablet escitalopram 20 mg tablet    [provider]  pantoprazole (PROTONIX) 40 MG tablet Take 1 tablet (40 mg total) by mouth daily. 11/29/20   Sherwood Gambler, MD  rizatriptan (MAXALT-MLT) 5 MG disintegrating tablet rizatriptan 5 mg disintegrating tablet    [provider]  traMADol (ULTRAM) 50 MG tablet Take 1 tablet (50 mg total) by mouth every 6 (six) hours as needed. 04/06/55   Delora Fuel, MD    Allergies     Dog epithelium allergy skin test, Other, and Shellfish-derived products  Review of Systems   Review of Systems  Constitutional: Negative for fever.  Skin: Positive for rash.    Physical Exam Updated Vital Signs BP 116/79 (BP Location: Left Arm)   Pulse 76   Temp 98.3 F (36.8 C) (Oral)   Resp (!) 30   LMP 05/10/2021 (Approximate)   SpO2 99%   Physical Exam Vitals and nursing note reviewed.  Constitutional:      General: She is not in acute distress.    Appearance: Normal appearance. She is well-developed.  HENT:     Head: Normocephalic and atraumatic.     Right Ear: Hearing normal.     Left Ear: Hearing normal.     Nose: Nose normal.  Eyes:     Conjunctiva/sclera: Conjunctivae normal.     Pupils: Pupils are equal, round, and reactive to light.  Cardiovascular:     Rate and Rhythm: Regular rhythm.     Heart sounds: S1 normal and S2 normal. No murmur heard. No friction rub. No gallop.   Pulmonary:     Effort: Pulmonary effort is normal. No respiratory distress.     Breath sounds: Normal breath sounds.  Chest:     Chest wall: No tenderness.  Abdominal:     General: Bowel  sounds are normal.     Palpations: Abdomen is soft.     Tenderness: There is no abdominal tenderness. There is no guarding or rebound. Negative signs include Murphy's sign and McBurney's sign.     Hernia: No hernia is present.  Musculoskeletal:        General: Normal range of motion.     Cervical back: Normal range of motion and neck supple.  Skin:    General: Skin is warm and dry.     Findings: No rash.     Comments: 6 cm diameter area of hyperpigmentation with surrounding erythema, slight induration but no fluctuance posterior aspect of left upper leg  Neurological:     Mental Status: She is alert and oriented to person, place, and time.     GCS: GCS eye subscore is 4. GCS verbal subscore is 5. GCS motor subscore is 6.     Cranial Nerves: No cranial nerve deficit.     Sensory: No sensory  deficit.     Coordination: Coordination normal.  Psychiatric:        Speech: Speech normal.        Behavior: Behavior normal.        Thought Content: Thought content normal.     ED Results / Procedures / Treatments   Labs (all labs ordered are listed, but only abnormal results are displayed) Labs Reviewed - No data to display  EKG None  Radiology No results found.  Procedures Procedures   Medications Ordered in ED Medications - No data to display  ED Course  I have reviewed the triage vital signs and the nursing notes.  Pertinent labs & imaging results that were available during my care of the patient were reviewed by me and considered in my medical decision making (see chart for details).    MDM Rules/Calculators/A&P                          Patient started with several itchy areas on her legs 3 days ago.  Most of the original lesions have resolved but she does have a large tender, swollen area on the back of her left upper leg.  Suspect that itchy lesion started as allergic response but now has some superinfection.  Will give dose of Decadron because she still is having a fair amount of itching and then cover with doxycycline which will cover for skin infection.  No known tick bites.  No fever or systemic symptoms.  Appears well.  Final Clinical Impression(s) / ED Diagnoses Final diagnoses:  Rash  Cellulitis of left lower extremity    Rx / DC Orders ED Discharge Orders    None       Orpah Greek, MD 06/01/21 [phone]

## 2021-06-06 ENCOUNTER — Encounter (HOSPITAL_COMMUNITY): Payer: Self-pay

## 2021-06-06 ENCOUNTER — Other Ambulatory Visit: Payer: Self-pay

## 2021-06-06 ENCOUNTER — Emergency Department (HOSPITAL_COMMUNITY)
Admission: EM | Admit: 2021-06-06 | Discharge: 2021-06-07 | Disposition: A | Discharge status: Home / Self Care | Payer: Medicaid Other | Attending: Emergency Medicine | Admitting: Emergency Medicine

## 2021-06-06 DIAGNOSIS — J45909 Unspecified asthma, uncomplicated: Secondary | ICD-10-CM | POA: Insufficient documentation

## 2021-06-06 DIAGNOSIS — U071 COVID-19: Secondary | ICD-10-CM | POA: Diagnosis not present

## 2021-06-06 DIAGNOSIS — J069 Acute upper respiratory infection, unspecified: Secondary | ICD-10-CM | POA: Diagnosis not present

## 2021-06-06 DIAGNOSIS — M79605 Pain in left leg: Secondary | ICD-10-CM | POA: Diagnosis not present

## 2021-06-06 DIAGNOSIS — R21 Rash and other nonspecific skin eruption: Secondary | ICD-10-CM | POA: Diagnosis not present

## 2021-06-06 DIAGNOSIS — R059 Cough, unspecified: Secondary | ICD-10-CM | POA: Diagnosis present

## 2021-06-06 NOTE — ED Triage Notes (Signed)
Pt states she was seen on 6/1 and diagnosed with a skin infection to the L thigh. Pt was Rx doxycycline, but pt states infection is getting worse. Pt now feeling fatigued, subjective fevers, decreased appetite, and body aches.

## 2021-06-06 NOTE — ED Provider Notes (Signed)
Emergency Medicine Provider Triage Evaluation Note  Carly Perez , a 30 y.o. female  was evaluated in triage.  Pt complains of rash to left thigh.  She reports that she was seen Emergency Department on 6/1 and diagnosed with cellulitis.  Patient was started on doxycycline.  Patient reports that she has been taking this medication as prescribed.  Patient reports that she has had no change in the size of the erythema or rash.  Patient reports that it has not improved.  Patient endorses associated pruritus and pain with rash.  No purulent discharge.  Patient also endorses subjective fevers, chills, productive cough, nasal congestion, myalgias.  She reports that she is fully vaccinated for COVID-19 and influenza.  Patient reports that she recently went to the beach for a vacation.  Review of Systems  Positive: Rash, subjective fever, chills, productive cough, nasal congestion, myalgias, nausea, diarrhea Negative: Purulent discharge,  Physical Exam  BP 126/62 (BP Location: Right Arm)   Pulse 80   Temp 98.7 F (37.1 C) (Oral)   Resp 17   Ht 5\' 3"  (1.6 m)   Wt 108.9 kg   LMP 05/31/2021   SpO2 95%   BMI 42.51 kg/m  Gen:   Awake, no distress   Resp:  Normal effort, patient speaks in full complete sentences without difficulty, lungs clear to auscultation bilaterally MSK:   Moves extremities without difficulty  Other:    Medical Decision Making  Medically screening exam initiated at 11:22 PM.  Appropriate orders placed.  Carly Perez was informed that the remainder of the evaluation will be completed by another provider, this initial triage assessment does not replace that evaluation, and the importance of remaining in the ED until their evaluation is complete.  The patient appears stable so that the remainder of the work up may be completed by another provider.      Carly Perez 06/06/21 2328    Carly Bo, MD 06/07/21 1126

## 2021-06-07 ENCOUNTER — Emergency Department (HOSPITAL_BASED_OUTPATIENT_CLINIC_OR_DEPARTMENT_OTHER): Payer: Medicaid Other

## 2021-06-07 DIAGNOSIS — M7989 Other specified soft tissue disorders: Secondary | ICD-10-CM

## 2021-06-07 DIAGNOSIS — R609 Edema, unspecified: Secondary | ICD-10-CM

## 2021-06-07 LAB — RESP PANEL BY RT-PCR (FLU A&B, COVID) ARPGX2
Influenza A by PCR: NEGATIVE
Influenza B by PCR: NEGATIVE
SARS Coronavirus 2 by RT PCR: POSITIVE — AB

## 2021-06-07 NOTE — ED Provider Notes (Signed)
[facility] DEPT Provider Note   CSN: [id number] Arrival date & time: 06/06/21  2236     History Chief Complaint  Patient presents with  . Leg Pain    Carly Perez is a 30 y.o. female.  30 year old female presents with complaint of ongoing pain in her left calf and left thigh.  Patient was seen here about a week ago for same, thought to have had a contact rash that was secondarily infected and started on doxycycline which she has been compliant with.  Also reports cough, congestion and feeling generally unwell for the past 5 to 6 days.  Patient recently [place] home from the beach, no known sick contacts.  No other complaints or concerns today.        Past Medical History:  Diagnosis Date  . Asthma   . Gout   . Sleep apnea     There are no problems to display for this patient.   Past Surgical History:  Procedure Laterality Date  . CYST EXCISION    . TONSILLECTOMY       OB History   No obstetric history on file.     No family history on file.  Social History   Tobacco Use  . Smoking status: Never Smoker  . Smokeless tobacco: Never Used  Vaping Use  . Vaping Use: Never used  Substance Use Topics  . Alcohol use: Never  . Drug use: Never    Home Medications Prior to Admission medications   Medication Sig Start Date End Date Taking? Authorizing Provider  albuterol (PROVENTIL) (2.5 MG/3ML) 0.083% nebulizer solution albuterol sulfate 2.5 mg/3 mL (0.083 %) solution for nebulization   Yes [provider]  albuterol (VENTOLIN HFA) 108 (90 Base) MCG/ACT inhaler Inhale 1-2 puffs into the lungs every 4 (four) hours as needed.   Yes [provider]  doxycycline (VIBRAMYCIN) 100 MG capsule Take 1 capsule (100 mg total) by mouth 2 (two) times daily. 06/01/21  Yes Pollina, Gwenyth Allegra, MD  escitalopram (LEXAPRO) 20 MG tablet escitalopram 20 mg tablet   Yes [provider]  traMADol (ULTRAM) 50 MG  tablet Take 1 tablet (50 mg total) by mouth every 6 (six) hours as needed. 01/01/74  Yes Delora Fuel, MD  cyclobenzaprine (FLEXERIL) 10 MG tablet Take 1 tablet (10 mg total) by mouth 3 (three) times daily as needed for muscle spasms. Patient not taking: No sig reported 01/06/99   Delora Fuel, MD  pantoprazole (PROTONIX) 40 MG tablet Take 1 tablet (40 mg total) by mouth daily. Patient not taking: Reported on 06/07/2021 11/29/20   Sherwood Gambler, MD    Allergies    Dog epithelium allergy skin test, Other, and Shellfish-derived products  Review of Systems   Review of Systems  Constitutional: Negative for chills and fever.  HENT: Positive for congestion.   Respiratory: Positive for cough. Negative for shortness of breath.   Gastrointestinal: Negative for abdominal pain and vomiting.  Musculoskeletal: Positive for arthralgias and myalgias.  Skin: Positive for rash. Negative for wound.  Allergic/Immunologic: Negative for immunocompromised state.  Neurological: Negative for weakness.  Hematological: Negative for adenopathy.  Psychiatric/Behavioral: Negative for confusion.  All other systems reviewed and are negative.   Physical Exam Updated Vital Signs BP 121/84   Pulse 70   Temp 98.7 F (37.1 C) (Oral)   Resp 17   Ht 5\' 3"  (1.6 m)   Wt 108.9 kg   LMP 05/31/2021   SpO2 96%   BMI 42.51  kg/m   Physical Exam Vitals and nursing note reviewed.  Constitutional:      General: She is not in acute distress.    Appearance: She is well-developed. She is not diaphoretic.  HENT:     Head: Normocephalic and atraumatic.  Cardiovascular:     Rate and Rhythm: Normal rate and regular rhythm.     Heart sounds: Normal heart sounds.  Pulmonary:     Effort: Pulmonary effort is normal.     Breath sounds: Normal breath sounds.  Abdominal:     Palpations: Abdomen is soft.     Tenderness: There is no abdominal tenderness.  Musculoskeletal:        General: Tenderness present.     Cervical back:  Neck supple.     Right lower leg: No edema.     Left lower leg: No edema.     Comments: Calf tenderness with hyperpigmented rash to posterior left calf and left thigh.  No erythema.  No palpable cords.  Skin:    General: Skin is warm and dry.     Findings: Rash present. No erythema.  Neurological:     Mental Status: She is alert and oriented to person, place, and time.     Sensory: No sensory deficit.  Psychiatric:        Behavior: Behavior normal.     ED Results / Procedures / Treatments   Labs (all labs ordered are listed, but only abnormal results are displayed) Labs Reviewed  RESP PANEL BY RT-PCR (FLU A&B, COVID) [id number]    EKG None  Radiology VAS [place] LOWER EXTREMITY VENOUS (DVT) ([facility] and [facility] 7a-7p)  Result Date: 06/07/2021  Lower Venous DVT Study Patient Name:  Carly Perez  Date of Exam:   06/07/2021 Medical Rec #: [id number]                  Accession #:    [id number] Date of Birth: [birth year]                 Patient Gender: F Patient Age:   29Y Exam Location:  [facility] Procedure:      VAS [place] LOWER EXTREMITY VENOUS (DVT) Referring Phys: [id number] Jermine Bibbee A Leilanni Halvorson --------------------------------------------------------------------------------  Indications: Swelling, and Edema.  Comparison Study: no prior Performing Technologist: Archie Patten RVS  Examination Guidelines: A complete evaluation includes B-mode imaging, spectral Doppler, color Doppler, and power Doppler as needed of all accessible portions of each vessel. Bilateral testing is considered an integral part of a complete examination. Limited examinations for reoccurring indications may be performed as noted. The reflux portion of the exam is performed with the patient in reverse Trendelenburg.  +-----+---------------+---------+-----------+----------+--------------+ RIGHTCompressibilityPhasicitySpontaneityPropertiesThrombus Aging +-----+---------------+---------+-----------+----------+--------------+  CFV  Full           Yes      Yes                                 +-----+---------------+---------+-----------+----------+--------------+   +---------+---------------+---------+-----------+----------+-------------------+ LEFT     CompressibilityPhasicitySpontaneityPropertiesThrombus Aging      +---------+---------------+---------+-----------+----------+-------------------+ CFV      Full           Yes      Yes                                      +---------+---------------+---------+-----------+----------+-------------------+ SFJ  Full                                                             +---------+---------------+---------+-----------+----------+-------------------+ FV Prox  Full                                                             +---------+---------------+---------+-----------+----------+-------------------+ FV Mid   Full                                                             +---------+---------------+---------+-----------+----------+-------------------+ FV DistalFull                                                             +---------+---------------+---------+-----------+----------+-------------------+ PFV      Full                                                             +---------+---------------+---------+-----------+----------+-------------------+ POP      Full           Yes      Yes                                      +---------+---------------+---------+-----------+----------+-------------------+ PTV      Full                                                             +---------+---------------+---------+-----------+----------+-------------------+ PERO                                                  Not well visualized +---------+---------------+---------+-----------+----------+-------------------+    Summary: RIGHT: - No evidence of common femoral vein obstruction.  LEFT: - There is no evidence of  deep vein thrombosis in the lower extremity.  - No cystic structure found in the popliteal fossa.  *See table(s) above for measurements and observations.    Preliminary     Procedures Procedures   Medications Ordered in ED Medications - No data to display  ED Course  I have reviewed the triage vital signs and the nursing notes.  Pertinent labs & imaging results that were  available during my care of the patient were reviewed by me and considered in my medical decision making (see chart for details).  Clinical Course as of 06/07/21 0901  Wed Jun 07, 4170  4734 30 year old female with complaint of URI symptoms for the past few days as well as concern for ongoing leg infection despite taking doxycycline.  Patient's leg appears to be improving, there is no erythema as needed provider visit.  Due to ongoing Pain, will evaluate for DVT with vascular ultrasound. [LM]  0900 Vascular ultrasound is negative for DVT.  Patient's COVID test for her URI symptoms is pending, she would like to be discharged.  Advised to quarantine pending her COVID test results. [LM]    Clinical Course User Index [LM] Roque Lias   MDM Rules/Calculators/A&P                          Final Clinical Impression(s) / ED Diagnoses Final diagnoses:  Left leg pain  Upper respiratory tract infection, unspecified type    Rx / DC Orders ED Discharge Orders    None       Tacy Learn, PA-C 06/07/21 0901    Valarie Merino, MD 06/07/21 1407

## 2021-06-07 NOTE — Progress Notes (Signed)
Lower extremity venous has been completed.   Preliminary results in CV Proc.   Abram Sander 06/07/2021 8:12 AM

## 2021-06-07 NOTE — Discharge Instructions (Addendum)
Quarantine pending your COVID test results.  Symptom management with Zyrtec, Flonase, Mucinex as needed as directed.

## 2021-06-08 ENCOUNTER — Encounter (HOSPITAL_COMMUNITY): Payer: Self-pay | Admitting: Emergency Medicine

## 2021-06-08 ENCOUNTER — Emergency Department (HOSPITAL_COMMUNITY): Payer: Medicaid Other

## 2021-06-08 ENCOUNTER — Other Ambulatory Visit: Payer: Self-pay

## 2021-06-08 ENCOUNTER — Emergency Department (HOSPITAL_COMMUNITY)
Admission: EM | Admit: 2021-06-08 | Discharge: 2021-06-09 | Disposition: A | Discharge status: Home / Self Care | Payer: Medicaid Other | Attending: Emergency Medicine | Admitting: Emergency Medicine

## 2021-06-08 DIAGNOSIS — R0602 Shortness of breath: Secondary | ICD-10-CM | POA: Diagnosis present

## 2021-06-08 DIAGNOSIS — J45909 Unspecified asthma, uncomplicated: Secondary | ICD-10-CM | POA: Insufficient documentation

## 2021-06-08 DIAGNOSIS — U071 COVID-19: Secondary | ICD-10-CM | POA: Insufficient documentation

## 2021-06-08 DIAGNOSIS — R079 Chest pain, unspecified: Secondary | ICD-10-CM

## 2021-06-08 DIAGNOSIS — R059 Cough, unspecified: Secondary | ICD-10-CM

## 2021-06-08 LAB — CBC WITH DIFFERENTIAL/PLATELET
Abs Immature Granulocytes: 0.04 10*3/uL (ref 0.00–0.07)
Basophils Absolute: 0 10*3/uL (ref 0.0–0.1)
Basophils Relative: 0 %
Eosinophils Absolute: 0.2 10*3/uL (ref 0.0–0.5)
Eosinophils Relative: 3 %
HCT: 40.7 % (ref 36.0–46.0)
Hemoglobin: 13 g/dL (ref 12.0–15.0)
Immature Granulocytes: 1 %
Lymphocytes Relative: 42 %
Lymphs Abs: 2.5 10*3/uL (ref 0.7–4.0)
MCH: 27.9 pg (ref 26.0–34.0)
MCHC: 31.9 g/dL (ref 30.0–36.0)
MCV: 87.3 fL (ref 80.0–100.0)
Monocytes Absolute: 0.7 10*3/uL (ref 0.1–1.0)
Monocytes Relative: 12 %
Neutro Abs: 2.6 10*3/uL (ref 1.7–7.7)
Neutrophils Relative %: 42 %
Platelets: 299 10*3/uL (ref 150–400)
RBC: 4.66 MIL/uL (ref 3.87–5.11)
RDW: 14.6 % (ref 11.5–15.5)
WBC: 6 10*3/uL (ref 4.0–10.5)
nRBC: 0 % (ref 0.0–0.2)

## 2021-06-08 LAB — I-STAT BETA HCG BLOOD, ED (MC, WL, AP ONLY): I-stat hCG, quantitative: <5 m[IU]/mL (ref <5)

## 2021-06-08 LAB — HEPATIC FUNCTION PANEL
ALT: 16 U/L (ref 0–44)
AST: 22 U/L (ref 15–41)
Albumin: 3 g/dL — ABNORMAL LOW (ref 3.5–5.0)
Alkaline Phosphatase: 54 U/L (ref 38–126)
Bilirubin, Direct: 0.2 mg/dL (ref 0.0–0.2)
Indirect Bilirubin: 0.3 mg/dL (ref 0.3–0.9)
Total Bilirubin: 0.5 mg/dL (ref 0.3–1.2)
Total Protein: 5.9 g/dL — ABNORMAL LOW (ref 6.5–8.1)

## 2021-06-08 LAB — BASIC METABOLIC PANEL
Anion gap: 7 (ref 5–15)
BUN: 7 mg/dL (ref 6–20)
CO2: 25 mmol/L (ref 22–32)
Calcium: 8.8 mg/dL — ABNORMAL LOW (ref 8.9–10.3)
Chloride: 107 mmol/L (ref 98–111)
Creatinine, Ser: 0.63 mg/dL (ref 0.44–1.00)
GFR, Estimated: >60 mL/min (ref >60)
Glucose, Bld: 90 mg/dL (ref 70–99)
Potassium: 4.4 mmol/L (ref 3.5–5.1)
Sodium: 139 mmol/L (ref 135–145)

## 2021-06-08 LAB — TROPONIN I (HIGH SENSITIVITY)
Troponin I (High Sensitivity): <2 ng/L (ref <18)
Troponin I (High Sensitivity): <2 ng/L (ref <18)

## 2021-06-08 LAB — LIPASE, BLOOD: Lipase: 27 U/L (ref 11–51)

## 2021-06-08 MED ORDER — CYCLOBENZAPRINE HCL 10 MG PO TABS: 10.0000 mg | ORAL_TABLET | Freq: Two times a day (BID) | ORAL | 0 refills | Status: DC | PRN

## 2021-06-08 MED ORDER — IOHEXOL 350 MG/ML SOLN
50.0000 mL | Freq: Once | INTRAVENOUS | Status: AC | PRN
  Administered 2021-06-08: 50 mL via INTRAVENOUS

## 2021-06-08 MED ORDER — MORPHINE SULFATE (PF) 4 MG/ML IV SOLN
4.0000 mg | Freq: Once | INTRAVENOUS | Status: AC
Start: 2021-06-08 — End: 2021-06-08
  Administered 2021-06-08: 4 mg via INTRAVENOUS
  Filled 2021-06-08: qty 1

## 2021-06-08 MED ORDER — MORPHINE SULFATE (PF) 4 MG/ML IV SOLN
4.0000 mg | Freq: Once | INTRAVENOUS | Status: AC
  Administered 2021-06-08: 4 mg via INTRAVENOUS
  Filled 2021-06-08: qty 1

## 2021-06-08 MED ORDER — ONDANSETRON HCL 4 MG/2ML IJ SOLN
4.0000 mg | Freq: Once | INTRAMUSCULAR | Status: AC
  Administered 2021-06-08: 4 mg via INTRAVENOUS
  Filled 2021-06-08: qty 2

## 2021-06-08 MED ORDER — NIRMATRELVIR/RITONAVIR (PAXLOVID)TABLET: 3.0000 | ORAL_TABLET | Freq: Two times a day (BID) | ORAL | 0 refills | Status: AC

## 2021-06-08 MED ORDER — SODIUM CHLORIDE 0.9 % IV BOLUS
500.0000 mL | Freq: Once | INTRAVENOUS | Status: AC
  Administered 2021-06-08: 500 mL via INTRAVENOUS

## 2021-06-08 MED ORDER — OXYCODONE-ACETAMINOPHEN 5-325 MG PO TABS: 1.0000 | ORAL_TABLET | ORAL | 0 refills | Status: DC | PRN

## 2021-06-08 NOTE — ED Triage Notes (Signed)
Pt c/o of worsening SOB after testing positive for COVID 6/7. C/o burning chest pain. States eating and drinking normally.

## 2021-06-08 NOTE — ED Provider Notes (Signed)
Emergency Medicine Provider Triage Evaluation Note  Carly Perez , a 30 y.o. female  was evaluated in triage.  Pt complains of shortness of breath. Diffuse Myalgias.  Diagnosed with COVID 2 days ago. Tolerating PO intake however with overall decreased intake secondary to lack of appetitive. No LE edema, Cough non productive  Review of Systems  Positive:  SOB Negative: Emesis, dysuria  Physical Exam  Ht 5\' 3"  (1.6 m)   Wt 108.9 kg   LMP 05/31/2021   BMI 42.51 kg/m  Gen:   Awake, no distress   Resp:  Normal effort  MSK:   Moves extremities without difficulty  Other:    Medical Decision Making  Medically screening exam initiated at 7:19 PM.  Appropriate orders placed.  Carly Perez was informed that the remainder of the evaluation will be completed by another provider, this initial triage assessment does not replace that evaluation, and the importance of remaining in the ED until their evaluation is complete.  COVID positive, SOB, weakness   Chas Axel A, PA-C 06/08/21 [name], MD 06/08/21 2323

## 2021-06-08 NOTE — ED Provider Notes (Signed)
[facility] EMERGENCY DEPARTMENT Provider Note   CSN: [id number] Arrival date & time: 06/08/21  1914     History Chief Complaint  Patient presents with   Covid Positive   Shortness of Breath    Carly Perez is a 30 y.o. female.  The history is provided by the patient and medical records. No language interpreter was used.  Shortness of Breath Severity:  Moderate Onset quality:  Gradual Duration:  3 days Timing:  Constant Progression:  Waxing and waning Chronicity:  New Context: URI   Relieved by:  Nothing Worsened by:  Coughing Associated symptoms: chest pain, cough, fever and sputum production   Associated symptoms: no abdominal pain, no diaphoresis, no headaches, no neck pain, no rash, no vomiting and no wheezing       Past Medical History:  Diagnosis Date   Asthma    Gout    Sleep apnea     There are no problems to display for this patient.   Past Surgical History:  Procedure Laterality Date   CYST EXCISION     TONSILLECTOMY       OB History   No obstetric history on file.     History reviewed. No pertinent family history.  Social History   Tobacco Use   Smoking status: Never   Smokeless tobacco: Never  Vaping Use   Vaping Use: Never used  Substance Use Topics   Alcohol use: Never   Drug use: Never    Home Medications Prior to Admission medications   Medication Sig Start Date End Date Taking? Authorizing Provider  albuterol (PROVENTIL) (2.5 MG/3ML) 0.083% nebulizer solution albuterol sulfate 2.5 mg/3 mL (0.083 %) solution for nebulization    [provider]  albuterol (VENTOLIN HFA) 108 (90 Base) MCG/ACT inhaler Inhale 1-2 puffs into the lungs every 4 (four) hours as needed.    [provider]  cyclobenzaprine (FLEXERIL) 10 MG tablet Take 1 tablet (10 mg total) by mouth 3 (three) times daily as needed for muscle spasms. Patient not taking: No sig reported 0/0/93   Delora Fuel, MD  doxycycline  (VIBRAMYCIN) 100 MG capsule Take 1 capsule (100 mg total) by mouth 2 (two) times daily. 06/01/21   Orpah Greek, MD  escitalopram (LEXAPRO) 20 MG tablet escitalopram 20 mg tablet    [provider]  pantoprazole (PROTONIX) 40 MG tablet Take 1 tablet (40 mg total) by mouth daily. Patient not taking: Reported on 06/07/2021 11/29/20   Sherwood Gambler, MD  traMADol (ULTRAM) 50 MG tablet Take 1 tablet (50 mg total) by mouth every 6 (six) hours as needed. 07/31/81   Delora Fuel, MD    Allergies    Dog epithelium allergy skin test, Other, and Shellfish-derived products  Review of Systems   Review of Systems  Constitutional:  Positive for chills, fatigue and fever. Negative for diaphoresis.  HENT:  Positive for congestion.   Eyes:  Negative for visual disturbance.  Respiratory:  Positive for cough, sputum production, chest tightness and shortness of breath. Negative for wheezing.   Cardiovascular:  Positive for chest pain. Negative for palpitations.  Gastrointestinal:  Negative for abdominal pain, constipation, diarrhea, nausea and vomiting.  Genitourinary:  Negative for dysuria.  Musculoskeletal:  Negative for back pain, neck pain and neck stiffness.  Skin:  Negative for rash and wound.  Neurological:  Negative for seizures, light-headedness and headaches.  Psychiatric/Behavioral:  Negative for agitation and confusion.   All other systems reviewed and are negative.  Physical  Exam Updated Vital Signs BP (!) 128/105   Pulse 82   Temp 98.6 F (37 C) (Oral)   Resp 20   Ht 5\' 3"  (1.6 m)   Wt 108.9 kg   LMP 05/31/2021   SpO2 100%   BMI 42.51 kg/m   Physical Exam Vitals and nursing note reviewed.  Constitutional:      General: She is not in acute distress.    Appearance: She is well-developed. She is not ill-appearing, toxic-appearing or diaphoretic.  HENT:     Head: Normocephalic and atraumatic.  Eyes:     Conjunctiva/sclera: Conjunctivae normal.     Pupils: Pupils  are equal, round, and reactive to light.  Cardiovascular:     Rate and Rhythm: Normal rate and regular rhythm.     Heart sounds: No murmur heard. Pulmonary:     Effort: Pulmonary effort is normal. No respiratory distress.     Breath sounds: Normal breath sounds. No decreased breath sounds, wheezing, rhonchi or rales.  Chest:     Chest wall: Tenderness present.  Abdominal:     Palpations: Abdomen is soft.     Tenderness: There is no abdominal tenderness.  Musculoskeletal:     Cervical back: Neck supple.     Right lower leg: No tenderness. No edema.     Left lower leg: No tenderness. No edema.  Skin:    General: Skin is warm and dry.     Capillary Refill: Capillary refill takes less than 2 seconds.     Findings: No erythema.  Neurological:     General: No focal deficit present.     Mental Status: She is alert.  Psychiatric:        Mood and Affect: Mood normal.    ED Results / Procedures / Treatments   Labs (all labs ordered are listed, but only abnormal results are displayed) Labs Reviewed  BASIC METABOLIC PANEL - Abnormal; Notable for the following components:      Result Value   Calcium 8.8 (*)    All other components within normal limits  HEPATIC FUNCTION PANEL - Abnormal; Notable for the following components:   Total Protein 5.9 (*)    Albumin 3.0 (*)    All other components within normal limits  CBC WITH DIFFERENTIAL/PLATELET  LIPASE, BLOOD  I-STAT BETA HCG BLOOD, ED ([facility], [facility], [facility] ONLY)  TROPONIN I (HIGH SENSITIVITY)  TROPONIN I (HIGH SENSITIVITY)    EKG EKG Interpretation  Date/Time:  Thursday June 08 2021 19:44:22 EDT Ventricular Rate:  66 PR Interval:  166 QRS Duration: 92 QT Interval:  390 QTC Calculation: 409 R Axis:   -8 Text Interpretation: Sinus rhythm LVH by voltage Nonspecific T abnormalities, diffuse leads no ECG visible before today. No STEMI Confirmed by Antony Blackbird [phone]) on 06/08/2021 7:57:06 PM  Radiology CT Angio Chest PE W and/or Wo  Contrast  Result Date: 06/08/2021 CLINICAL DATA:  Recent DVT with pleuritic chest pain. EXAM: CT ANGIOGRAPHY CHEST WITH CONTRAST TECHNIQUE: Multidetector CT imaging of the chest was performed using the standard protocol during bolus administration of intravenous contrast. Multiplanar CT image reconstructions and MIPs were obtained to evaluate the vascular anatomy. CONTRAST:  65mL OMNIPAQUE IOHEXOL 350 MG/ML SOLN COMPARISON:  None. FINDINGS: Cardiovascular: Satisfactory opacification of the pulmonary arteries to the segmental level. No evidence of pulmonary embolism. Normal heart size. No pericardial effusion. Mediastinum/Nodes: No enlarged mediastinal, hilar, or axillary lymph nodes. Thyroid gland, trachea, and esophagus demonstrate no significant findings. Lungs/Pleura: Lungs are clear. No pleural  effusion or pneumothorax. Upper Abdomen: No acute abnormality. Musculoskeletal: No chest wall abnormality. No acute or significant osseous findings. Review of the MIP images confirms the above findings. IMPRESSION: No evidence of pulmonary embolism or acute cardiopulmonary disease. Electronically Signed   By: Virgina Norfolk M.D.   On: 06/08/2021 21:48   DG Chest Port 1 View  Result Date: 06/08/2021 CLINICAL DATA:  COVID positive.  Shortness breath. EXAM: PORTABLE CHEST 1 VIEW COMPARISON:  April 04, 2021 FINDINGS: The heart size and mediastinal contours are within normal limits. Both lungs are clear. The visualized skeletal structures are unremarkable. IMPRESSION: No active disease. Electronically Signed   By: Abelardo Diesel M.D.   On: 06/08/2021 20:08   VAS [place] LOWER EXTREMITY VENOUS (DVT) ([facility] and [facility] 7a-7p)  Result Date: 06/07/2021  Lower Venous DVT Study Patient Name:  Carly Perez  Date of Exam:   06/07/2021 Medical Rec #: [id number]                  Accession #:    [id number] Date of Birth: [birth year]                 Patient Gender: F Patient Age:   29Y Exam Location:  [facility] Procedure:       VAS [place] LOWER EXTREMITY VENOUS (DVT) Referring Phys: [id number] LAURA A MURPHY --------------------------------------------------------------------------------  Indications: Swelling, and Edema.  Comparison Study: no prior Performing Technologist: Archie Patten RVS  Examination Guidelines: A complete evaluation includes B-mode imaging, spectral Doppler, color Doppler, and power Doppler as needed of all accessible portions of each vessel. Bilateral testing is considered an integral part of a complete examination. Limited examinations for reoccurring indications may be performed as noted. The reflux portion of the exam is performed with the patient in reverse Trendelenburg.  +-----+---------------+---------+-----------+----------+--------------+ RIGHTCompressibilityPhasicitySpontaneityPropertiesThrombus Aging +-----+---------------+---------+-----------+----------+--------------+ CFV  Full           Yes      Yes                                 +-----+---------------+---------+-----------+----------+--------------+   +---------+---------------+---------+-----------+----------+-------------------+ LEFT     CompressibilityPhasicitySpontaneityPropertiesThrombus Aging      +---------+---------------+---------+-----------+----------+-------------------+ CFV      Full           Yes      Yes                                      +---------+---------------+---------+-----------+----------+-------------------+ SFJ      Full                                                             +---------+---------------+---------+-----------+----------+-------------------+ FV Prox  Full                                                             +---------+---------------+---------+-----------+----------+-------------------+ FV Mid   Full                                                             +---------+---------------+---------+-----------+----------+-------------------+  FV  DistalFull                                                             +---------+---------------+---------+-----------+----------+-------------------+ PFV      Full                                                             +---------+---------------+---------+-----------+----------+-------------------+ POP      Full           Yes      Yes                                      +---------+---------------+---------+-----------+----------+-------------------+ PTV      Full                                                             +---------+---------------+---------+-----------+----------+-------------------+ PERO                                                  Not well visualized +---------+---------------+---------+-----------+----------+-------------------+    Summary: RIGHT: - No evidence of common femoral vein obstruction.  LEFT: - There is no evidence of deep vein thrombosis in the lower extremity.  - No cystic structure found in the popliteal fossa.  *See table(s) above for measurements and observations. Electronically signed by Jamelle Haring on 06/07/2021 at 7:14:17 PM.    Final     Procedures Procedures   Medications Ordered in ED Medications  morphine 4 MG/ML injection 4 mg (4 mg Intravenous Given 06/08/21 2059)  ondansetron (ZOFRAN) injection 4 mg (4 mg Intravenous Given 06/08/21 2058)  sodium chloride 0.9 % bolus 500 mL (0 mLs Intravenous Stopped 06/08/21 2211)  iohexol (OMNIPAQUE) 350 MG/ML injection 50 mL (50 mLs Intravenous Contrast Given 06/08/21 2128)  morphine 4 MG/ML injection 4 mg (4 mg Intravenous Given 06/08/21 2310)    ED Course  I have reviewed the triage vital signs and the nursing notes.  Pertinent labs & imaging results that were available during my care of the patient were reviewed by me and considered in my medical decision making (see chart for details).    MDM Rules/Calculators/A&P                          Carly Perez is a 30  y.o. female with a past medical history significant for gout, asthma, and recent diagnosis of COVID-19 infection who presents with chest pain and shortness of breath.  Patient reports that for the last couple days she has had URI symptoms with worsening chest pain, shortness breath, and cough.  She denies any recurrent leg pain or leg swelling.  She  reports some nausea and some diarrhea and generalized fatigue.  She presents for evaluation.  On exam, lungs are clear and chest is slightly tender to palpation.  Abdomen is nontender.  Good pulses in extremities.  Legs are nontender on my exam.  Intact pulses in extremities.  Vital signs reassuring on arrival.  EKG does not show STEMI.  Due to patient's symptoms and some tachycardia arrival, we will get work-up to rule out a cardiac or even a PE because of her symptoms.  PE study was negative.  Chest x-ray reassuring.  Other labs and work-up overall reassuring.  Patient is feeling better after some medications.  Patient will be given provision for PAxlovid and will follow-up with outpatient PCP.  She will give prescription for pain medicine and muscle relaxant as a suspect is more chest wall discomfort in relation to the coughing and COVID.  Patient otherwise agrees with plan of care and was discharged in good condition with improving symptoms.      Final Clinical Impression(s) / ED Diagnoses Final diagnoses:  SOB (shortness of breath)  Chest pain, unspecified type  Cough  COVID-19    Rx / DC Orders ED Discharge Orders          Ordered    nirmatrelvir/ritonavir EUA (PAXLOVID) TABS  2 times daily        06/08/21 2347    cyclobenzaprine (FLEXERIL) 10 MG tablet  2 times daily PRN        06/08/21 2347    oxyCODONE-acetaminophen (PERCOCET/ROXICET) 5-325 MG tablet  Every 4 hours PRN        06/08/21 2348            Clinical Impression: 1. Chest pain, unspecified type   2. SOB (shortness of breath)   3. Cough   4. COVID-19      Disposition: Discharge  Condition: Good  I have discussed the results, Dx and Tx plan with the pt(& family if present). He/she/they expressed understanding and agree(s) with the plan. Discharge instructions discussed at great length. Strict return precautions discussed and pt &/or family have verbalized understanding of the instructions. No further questions at time of discharge.    New Prescriptions   CYCLOBENZAPRINE (FLEXERIL) 10 MG TABLET    Take 1 tablet (10 mg total) by mouth 2 (two) times daily as needed for muscle spasms.   NIRMATRELVIR/RITONAVIR EUA (PAXLOVID) TABS    Take 3 tablets by mouth 2 (two) times daily for 5 days. Patient GFR is >60. Take nirmatrelvir (150 mg) two tablets twice daily for 5 days and ritonavir (100 mg) one tablet twice daily for 5 days.   OXYCODONE-ACETAMINOPHEN (PERCOCET/ROXICET) 5-325 MG TABLET    Take 1 tablet by mouth every 4 (four) hours as needed for severe pain.    Follow Up: Thomas Hilbert [id number] [phone] Schedule an appointment as soon as possible for a visit    [place] [address] [phone] mc [place]       Sravya Grissom, Gwenyth Allegra, MD 06/08/21 2356

## 2021-06-08 NOTE — ED Notes (Signed)
Patient transported to CT 

## 2021-06-08 NOTE — Discharge Instructions (Addendum)
Your history, exam, work-up today are consistent with chest pain likely due to your COVID-19 infection.  Your work-up today did not show evidence of any blood clot in your lungs on the CT scan and your heart enzymes are reassuring.  I suspect your symptoms are primarily chest wall and musculoskeletal in nature.  Please use the pain medicine and the muscle relaxant help with your symptoms and please start taking the COVID medication as we discussed.  If any symptoms change or worsen acutely, please return to the nearest emergency department.

## 2021-06-08 NOTE — ED Notes (Signed)
Lab to add-on lipase and hepatic function labs

## 2021-08-15 ENCOUNTER — Emergency Department (HOSPITAL_COMMUNITY): Payer: Medicaid Other

## 2021-08-15 ENCOUNTER — Encounter (HOSPITAL_COMMUNITY): Payer: Self-pay

## 2021-08-15 ENCOUNTER — Other Ambulatory Visit: Payer: Self-pay

## 2021-08-15 ENCOUNTER — Emergency Department (HOSPITAL_COMMUNITY)
Admission: EM | Admit: 2021-08-15 | Discharge: 2021-08-15 | Disposition: A | Discharge status: Home / Self Care | Payer: Medicaid Other | Attending: Emergency Medicine | Admitting: Emergency Medicine

## 2021-08-15 DIAGNOSIS — M5442 Lumbago with sciatica, left side: Secondary | ICD-10-CM | POA: Insufficient documentation

## 2021-08-15 DIAGNOSIS — R110 Nausea: Secondary | ICD-10-CM | POA: Insufficient documentation

## 2021-08-15 DIAGNOSIS — Z8616 Personal history of COVID-19: Secondary | ICD-10-CM | POA: Diagnosis not present

## 2021-08-15 DIAGNOSIS — J45909 Unspecified asthma, uncomplicated: Secondary | ICD-10-CM | POA: Diagnosis not present

## 2021-08-15 DIAGNOSIS — M5441 Lumbago with sciatica, right side: Secondary | ICD-10-CM | POA: Insufficient documentation

## 2021-08-15 DIAGNOSIS — M545 Low back pain, unspecified: Secondary | ICD-10-CM | POA: Diagnosis present

## 2021-08-15 DIAGNOSIS — D259 Leiomyoma of uterus, unspecified: Secondary | ICD-10-CM | POA: Diagnosis not present

## 2021-08-15 DIAGNOSIS — Z20822 Contact with and (suspected) exposure to covid-19: Secondary | ICD-10-CM | POA: Insufficient documentation

## 2021-08-15 DIAGNOSIS — R509 Fever, unspecified: Secondary | ICD-10-CM | POA: Diagnosis not present

## 2021-08-15 LAB — URINALYSIS, ROUTINE W REFLEX MICROSCOPIC
Bilirubin Urine: NEGATIVE
Glucose, UA: NEGATIVE mg/dL
Hgb urine dipstick: NEGATIVE
Ketones, ur: NEGATIVE mg/dL
Leukocytes,Ua: NEGATIVE
Nitrite: NEGATIVE
Protein, ur: NEGATIVE mg/dL
Specific Gravity, Urine: 1.021 (ref 1.005–1.030)
pH: 6 (ref 5.0–8.0)

## 2021-08-15 LAB — CBC WITH DIFFERENTIAL/PLATELET
Abs Immature Granulocytes: 0.03 10*3/uL (ref 0.00–0.07)
Basophils Absolute: 0 10*3/uL (ref 0.0–0.1)
Basophils Relative: 0 %
Eosinophils Absolute: 0.2 10*3/uL (ref 0.0–0.5)
Eosinophils Relative: 3 %
HCT: 38.9 % (ref 36.0–46.0)
Hemoglobin: 12.4 g/dL (ref 12.0–15.0)
Immature Granulocytes: 1 %
Lymphocytes Relative: 31 %
Lymphs Abs: 1.9 10*3/uL (ref 0.7–4.0)
MCH: 27.9 pg (ref 26.0–34.0)
MCHC: 31.9 g/dL (ref 30.0–36.0)
MCV: 87.4 fL (ref 80.0–100.0)
Monocytes Absolute: 0.6 10*3/uL (ref 0.1–1.0)
Monocytes Relative: 9 %
Neutro Abs: 3.5 10*3/uL (ref 1.7–7.7)
Neutrophils Relative %: 56 %
Platelets: 255 10*3/uL (ref 150–400)
RBC: 4.45 MIL/uL (ref 3.87–5.11)
RDW: 14.5 % (ref 11.5–15.5)
WBC: 6.3 10*3/uL (ref 4.0–10.5)
nRBC: 0 % (ref 0.0–0.2)

## 2021-08-15 LAB — RESP PANEL BY RT-PCR (FLU A&B, COVID) ARPGX2
Influenza A by PCR: NEGATIVE
Influenza B by PCR: NEGATIVE
SARS Coronavirus 2 by RT PCR: NEGATIVE

## 2021-08-15 LAB — COMPREHENSIVE METABOLIC PANEL
ALT: 17 U/L (ref 0–44)
AST: 23 U/L (ref 15–41)
Albumin: 3.7 g/dL (ref 3.5–5.0)
Alkaline Phosphatase: 61 U/L (ref 38–126)
Anion gap: 6 (ref 5–15)
BUN: 8 mg/dL (ref 6–20)
CO2: 25 mmol/L (ref 22–32)
Calcium: 8.9 mg/dL (ref 8.9–10.3)
Chloride: 105 mmol/L (ref 98–111)
Creatinine, Ser: 0.69 mg/dL (ref 0.44–1.00)
GFR, Estimated: >60 mL/min (ref >60)
Glucose, Bld: 82 mg/dL (ref 70–99)
Potassium: 4 mmol/L (ref 3.5–5.1)
Sodium: 136 mmol/L (ref 135–145)
Total Bilirubin: 0.7 mg/dL (ref 0.3–1.2)
Total Protein: 6.8 g/dL (ref 6.5–8.1)

## 2021-08-15 LAB — LIPASE, BLOOD: Lipase: 28 U/L (ref 11–51)

## 2021-08-15 LAB — I-STAT BETA HCG BLOOD, ED (MC, WL, AP ONLY): I-stat hCG, quantitative: <5 m[IU]/mL (ref <5)

## 2021-08-15 MED ORDER — OXYCODONE-ACETAMINOPHEN 5-325 MG PO TABS
2.0000 | ORAL_TABLET | Freq: Once | ORAL | Status: AC
  Administered 2021-08-15: 2 via ORAL
  Filled 2021-08-15: qty 2

## 2021-08-15 MED ORDER — IOHEXOL 350 MG/ML SOLN
100.0000 mL | Freq: Once | INTRAVENOUS | Status: AC | PRN
  Administered 2021-08-15: 100 mL via INTRAVENOUS

## 2021-08-15 NOTE — Discharge Instructions (Addendum)
Please call for GYN follow-up. Take ibuprofen as needed for pain Return if you are having worsening symptoms.

## 2021-08-15 NOTE — ED Provider Notes (Signed)
[facility] EMERGENCY DEPARTMENT Provider Note   CSN: [id number] Arrival date & time: 08/15/21  0745     History Chief Complaint  Patient presents with   Back Pain   Leg Pain    Carly Perez is a 30 y.o. female.  HPI 30 year old female G0 P0 unclear of last menstrual period as she has irregular menses, presents today complaining of low back pain radiating down to posterior bilateral legs, and lower crampy abdominal pain with nausea and decreased p.o. intake secondary to this.  She states that she had some low back pain back in June.  She began having worsening pain over the past 3 days.  The pain is radiating to her bilateral lower quadrants since that time.  She has had some subjective fever and chills.  She has had some pain and increased frequency of urination.  She states that she is not sexually active with men and does not think she could have a sexually transmitted infection or pregnancy.  She denies any abnormal vaginal discharge.  She denies any weakness, loss of bowel or bladder control.    Past Medical History:  Diagnosis Date   Asthma    Gout    Sleep apnea     There are no problems to display for this patient.   Past Surgical History:  Procedure Laterality Date   CYST EXCISION     TONSILLECTOMY       OB History   No obstetric history on file.     History reviewed. No pertinent family history.  Social History   Tobacco Use   Smoking status: Never   Smokeless tobacco: Never  Vaping Use   Vaping Use: Never used  Substance Use Topics   Alcohol use: Never   Drug use: Never    Home Medications Prior to Admission medications   Medication Sig Start Date End Date Taking? Authorizing Provider  albuterol (PROVENTIL) (2.5 MG/3ML) 0.083% nebulizer solution albuterol sulfate 2.5 mg/3 mL (0.083 %) solution for nebulization    [provider]  albuterol (VENTOLIN HFA) 108 (90 Base) MCG/ACT inhaler Inhale 1-2 puffs into  the lungs every 4 (four) hours as needed.    [provider]  cyclobenzaprine (FLEXERIL) 10 MG tablet Take 1 tablet (10 mg total) by mouth 3 (three) times daily as needed for muscle spasms. Patient not taking: No sig reported 0000000   Delora Fuel, MD  cyclobenzaprine (FLEXERIL) 10 MG tablet Take 1 tablet (10 mg total) by mouth 2 (two) times daily as needed for muscle spasms. 06/08/21   Tegeler, Gwenyth Allegra, MD  doxycycline (VIBRAMYCIN) 100 MG capsule Take 1 capsule (100 mg total) by mouth 2 (two) times daily. 06/01/21   Orpah Greek, MD  escitalopram (LEXAPRO) 20 MG tablet escitalopram 20 mg tablet    [provider]  oxyCODONE-acetaminophen (PERCOCET/ROXICET) 5-325 MG tablet Take 1 tablet by mouth every 4 (four) hours as needed for severe pain. 06/08/21   Tegeler, Gwenyth Allegra, MD  pantoprazole (PROTONIX) 40 MG tablet Take 1 tablet (40 mg total) by mouth daily. Patient not taking: Reported on 06/07/2021 11/29/20   Sherwood Gambler, MD  traMADol (ULTRAM) 50 MG tablet Take 1 tablet (50 mg total) by mouth every 6 (six) hours as needed. 0000000   Delora Fuel, MD    Allergies    Dog epithelium allergy skin test, Other, and Shellfish-derived products  Review of Systems   Review of Systems  All other systems reviewed and are negative.  Physical Exam Updated Vital Signs BP 113/73 (BP Location: Right Arm)   Pulse 81   Temp 98.8 F (37.1 C) (Oral)   Resp 17   Ht 1.6 m ('5\' 3"'$ )   Wt 82.6 kg   LMP 07/17/2021 (Approximate)   SpO2 100%   BMI 32.24 kg/m   Physical Exam Vitals reviewed.  Constitutional:      Appearance: Normal appearance.  HENT:     Head: Normocephalic.     Right Ear: External ear normal.     Left Ear: External ear normal.     Nose: Nose normal.     Mouth/Throat:     Pharynx: Oropharynx is clear.  Eyes:     Pupils: Pupils are equal, round, and reactive to light.  Cardiovascular:     Rate and Rhythm: Normal rate and regular rhythm.     Pulses:  Normal pulses.  Pulmonary:     Effort: Pulmonary effort is normal.  Abdominal:     General: Abdomen is flat. Bowel sounds are normal.     Palpations: Abdomen is soft.     Tenderness: There is abdominal tenderness.     Comments: Bilateral lower quadrant tenderness to palpation no rebound  Musculoskeletal:        General: Normal range of motion.     Cervical back: Normal range of motion.  Skin:    Capillary Refill: Capillary refill takes less than 2 seconds.  Neurological:     General: No focal deficit present.     Mental Status: She is alert.  Psychiatric:        Mood and Affect: Mood normal.        Behavior: Behavior normal.    ED Results / Procedures / Treatments   Labs (all labs ordered are listed, but only abnormal results are displayed) Labs Reviewed  RESP PANEL BY RT-PCR (FLU A&B, COVID) ARPGX2  COMPREHENSIVE METABOLIC PANEL  LIPASE, BLOOD  CBC WITH DIFFERENTIAL/PLATELET  URINALYSIS, ROUTINE W REFLEX MICROSCOPIC  I-STAT BETA HCG BLOOD, ED ([facility], [facility], [facility] ONLY)    EKG None  Radiology CT ABDOMEN PELVIS W CONTRAST  Result Date: 08/15/2021 CLINICAL DATA:  Acute abdominal pain, nonlocalized abdominal pain in a 30 year old female. EXAM: CT ABDOMEN AND PELVIS WITH CONTRAST TECHNIQUE: Multidetector CT imaging of the abdomen and pelvis was performed using the standard protocol following bolus administration of intravenous contrast. CONTRAST:  177m OMNIPAQUE IOHEXOL 350 MG/ML SOLN COMPARISON:  CT angiography of the chest from June of 2022, abdominal sonogram from November of 2021. FINDINGS: Lower chest: No acute process at the lung bases. Hepatobiliary: No focal, suspicious hepatic lesion. No pericholecystic stranding. No biliary duct dilation. Portal vein is patent. Pancreas: Normal, without mass, inflammation or ductal dilatation. Spleen: Spleen normal size and contour. Adrenals/Urinary Tract: Adrenal glands are normal. Symmetric renal enhancement. No hydronephrosis. No perinephric  stranding. Urinary bladder with smooth contours. Under distension of the urinary bladder does limit assessment no perivesical stranding. No suspicious renal lesion. Nephrolithiasis in the lower pole of the LEFT kidney. Stomach/Bowel: Normal appendix. No sign of small bowel obstruction or adjacent stranding. Stomach grossly unremarkable. Colon without signs of adjacent inflammation. Moderate stool throughout the colon. Vascular/Lymphatic: Patent portal vein. Normal caliber of the abdominal aorta with smooth contour of the IVC. There is no gastrohepatic or hepatoduodenal ligament lymphadenopathy. No retroperitoneal or mesenteric lymphadenopathy. No pelvic sidewall lymphadenopathy. Reproductive: Bulky lobulated appearance of the uterus. Partially calcified leiomyoma arising from the RIGHT uterine fundus measuring approximately 6.9 x 5.5 cm. Another  ovoid lesion with similar enhancement pattern but without calcification extends towards the LEFT adnexa 4.3 x 3.5 cm. This appears to be separate from the LEFT ovary which shows a corpus luteum. RIGHT ovary likely above RIGHT uterine fundus but not well evaluated on CT. Other small more heterogeneous areas in the uterus likely small leiomyomata along the lower uterine segment. Other: Small amount of pelvic fluid in the cul-de-sac in the range of would be expected for physiologic fluid measuring simple fluid density. Musculoskeletal: No acute bone finding or destructive bone process. IMPRESSION: 1. No acute findings in the abdomen or pelvis. 2. Leiomyomata about the uterus largest on the RIGHT extending towards RIGHT adnexa. 3. Other small more heterogeneous areas in the uterus likely small leiomyomata along the lower uterine segment. Correlate with any pelvic symptoms with follow-up sonography as warranted. 4. Nephrolithiasis. 5. Moderate stool throughout the colon. 6. Normal appendix. Electronically Signed   By: Zetta Bills M.D.   On: 08/15/2021 15:36     Procedures Procedures   Medications Ordered in ED Medications  oxyCODONE-acetaminophen (PERCOCET/ROXICET) 5-325 MG per tablet 2 tablet (has no administration in time range)    ED Course  I have reviewed the triage vital signs and the nursing notes.  Pertinent labs & imaging results that were available during my care of the patient were reviewed by me and considered in my medical decision making (see chart for details).    MDM Rules/Calculators/A&P                           30 year old female presents with low back pain and crampy bilateral lower abdominal pain.  Labs, urinalysis, pregnancy test, all negative and normal.  Due to abdominal pain and lower quadrant tenderness, patient had CT of the abdomen pelvis performed.  Patient with large fibroid of uterus noted on CT scan.  She is aware of this is a prior diagnosis.  She is referred to GYN for follow-up. Final Clinical Impression(s) / ED Diagnoses Final diagnoses:  Acute bilateral low back pain with bilateral sciatica  Uterine leiomyoma, unspecified location    Rx / DC Orders ED Discharge Orders     None        Pattricia Boss, MD 08/15/21 1827

## 2021-08-15 NOTE — ED Notes (Signed)
Pt verbalizes understanding of discharge instructions. Opportunity for questions and answers were provided. Pt discharged from the ED.   ?

## 2021-08-15 NOTE — ED Triage Notes (Signed)
Pt reports lower back pain, lower abd pain and bilateral calf pain for the past 2 days. Denies dysuria.

## 2021-08-15 NOTE — ED Provider Notes (Signed)
Emergency Medicine Provider Triage Evaluation Note  Carly Perez , a 30 y.o. female  was evaluated in triage.  Pt complains of bilateral lower back pain, lower abdominal pain, and body aches for the past 2 days. Pt denies fevers or chills. No nausea, vomiting, diarrhea. She does endorse fatigue. No recent sick contacts. No urinary symptoms.  Review of Systems  Positive: + back pain, abdominal pain, body aches Negative: - fevers, cough  Physical Exam  BP 119/77 (BP Location: Right Arm)   Pulse 79   Temp 98.1 F (36.7 C)   Resp 18   Ht '5\' 3"'$  (1.6 m)   Wt 82.6 kg   LMP 07/17/2021 (Approximate)   SpO2 100%   BMI 32.24 kg/m  Gen:   Awake, no distress   Resp:  Normal effort  MSK:   Moves extremities without difficulty  Other:  Mild diffuse abdominal TTP and bilateral paralumbar musculature TTP. ROM intact to back and neck. Moving all extremities without difficulty.   Medical Decision Making  Medically screening exam initiated at 8:03 AM.  Appropriate orders placed.  Carly Perez was informed that the remainder of the evaluation will be completed by another provider, this initial triage assessment does not replace that evaluation, and the importance of remaining in the ED until their evaluation is complete.  Labs and COVID test ordered.    Eustaquio Maize, PA-C 08/15/21 [id number]    Pattricia Boss, MD 08/15/21 [id number]

## 2021-09-15 DIAGNOSIS — R1084 Generalized abdominal pain: Secondary | ICD-10-CM | POA: Diagnosis not present

## 2021-09-15 DIAGNOSIS — D259 Leiomyoma of uterus, unspecified: Secondary | ICD-10-CM | POA: Diagnosis not present

## 2021-09-15 DIAGNOSIS — R69 Illness, unspecified: Secondary | ICD-10-CM | POA: Diagnosis not present

## 2021-09-15 DIAGNOSIS — R112 Nausea with vomiting, unspecified: Secondary | ICD-10-CM | POA: Diagnosis not present

## 2021-09-15 DIAGNOSIS — K219 Gastro-esophageal reflux disease without esophagitis: Secondary | ICD-10-CM | POA: Diagnosis not present

## 2021-09-15 DIAGNOSIS — J45909 Unspecified asthma, uncomplicated: Secondary | ICD-10-CM | POA: Diagnosis not present

## 2021-09-15 DIAGNOSIS — G4733 Obstructive sleep apnea (adult) (pediatric): Secondary | ICD-10-CM | POA: Diagnosis not present

## 2021-09-15 DIAGNOSIS — Z6841 Body Mass Index (BMI) 40.0 and over, adult: Secondary | ICD-10-CM | POA: Diagnosis not present

## 2021-09-15 DIAGNOSIS — E669 Obesity, unspecified: Secondary | ICD-10-CM | POA: Diagnosis not present

## 2021-12-19 ENCOUNTER — Encounter (HOSPITAL_COMMUNITY): Payer: Self-pay | Admitting: Emergency Medicine

## 2021-12-19 ENCOUNTER — Other Ambulatory Visit: Payer: Self-pay

## 2021-12-19 ENCOUNTER — Emergency Department (HOSPITAL_COMMUNITY)
Admission: EM | Admit: 2021-12-19 | Discharge: 2021-12-19 | Disposition: A | Discharge status: Home / Self Care | Payer: 59 | Attending: Emergency Medicine | Admitting: Emergency Medicine

## 2021-12-19 DIAGNOSIS — G43909 Migraine, unspecified, not intractable, without status migrainosus: Secondary | ICD-10-CM | POA: Diagnosis not present

## 2021-12-19 DIAGNOSIS — J45909 Unspecified asthma, uncomplicated: Secondary | ICD-10-CM | POA: Insufficient documentation

## 2021-12-19 DIAGNOSIS — G43009 Migraine without aura, not intractable, without status migrainosus: Secondary | ICD-10-CM | POA: Insufficient documentation

## 2021-12-19 MED ORDER — SODIUM CHLORIDE 0.9 % IV BOLUS
500.0000 mL | Freq: Once | INTRAVENOUS | Status: AC
  Administered 2021-12-19: 20:00:00 500 mL via INTRAVENOUS

## 2021-12-19 MED ORDER — METOCLOPRAMIDE HCL 5 MG/ML IJ SOLN
5.0000 mg | Freq: Once | INTRAMUSCULAR | Status: AC
  Administered 2021-12-19: 20:00:00 5 mg via INTRAVENOUS
  Filled 2021-12-19: qty 2

## 2021-12-19 MED ORDER — DIPHENHYDRAMINE HCL 50 MG/ML IJ SOLN
12.5000 mg | Freq: Once | INTRAMUSCULAR | Status: AC
  Administered 2021-12-19: 20:00:00 12.5 mg via INTRAVENOUS
  Filled 2021-12-19: qty 1

## 2021-12-19 MED ORDER — ONDANSETRON 4 MG PO TBDP
4.0000 mg | ORAL_TABLET | Freq: Once | ORAL | Status: AC
  Administered 2021-12-19: 16:00:00 4 mg via ORAL
  Filled 2021-12-19: qty 1

## 2021-12-19 MED ORDER — KETOROLAC TROMETHAMINE 30 MG/ML IJ SOLN
15.0000 mg | Freq: Once | INTRAMUSCULAR | Status: AC
  Administered 2021-12-19: 20:00:00 15 mg via INTRAVENOUS
  Filled 2021-12-19: qty 1

## 2021-12-19 NOTE — ED Notes (Signed)
Pt ambulatory to room with independent steady gait 

## 2021-12-19 NOTE — Discharge Instructions (Signed)

## 2021-12-19 NOTE — ED Provider Notes (Signed)
[facility] DEPT Provider Note   CSN: [id number] Arrival date & time: 12/19/21  1529     History Chief Complaint  Patient presents with   Migraine     Carly Perez is a 30 y.o. female who complains of migraine headache for 4 day(s). She has a well established history of recurrent migraines.  Description of pain: throbbing pain, bilateral in the frontal area. Associated symptoms: light sensitivity and nausea. Patient has already taken otc meds for this headache without relief.  There are no associated abnormal neurological symptoms such as TIA's, loss of balance, loss of vision or speech, numbness or weakness on review. Past neurological history: negative for stroke, MS, epilepsy, or brain tumor.    Migraine      Past Medical History:  Diagnosis Date   Asthma    Gout    Sleep apnea     There are no problems to display for this patient.   Past Surgical History:  Procedure Laterality Date   CYST EXCISION     TONSILLECTOMY       OB History   No obstetric history on file.     History reviewed. No pertinent family history.  Social History   Tobacco Use   Smoking status: Never   Smokeless tobacco: Never  Vaping Use   Vaping Use: Never used  Substance Use Topics   Alcohol use: Never   Drug use: Never    Home Medications Prior to Admission medications   Medication Sig Start Date End Date Taking? Authorizing Provider  albuterol (PROVENTIL) (2.5 MG/3ML) 0.083% nebulizer solution albuterol sulfate 2.5 mg/3 mL (0.083 %) solution for nebulization    [provider]  albuterol (VENTOLIN HFA) 108 (90 Base) MCG/ACT inhaler Inhale 1-2 puffs into the lungs every 4 (four) hours as needed.    [provider]  cyclobenzaprine (FLEXERIL) 10 MG tablet Take 1 tablet (10 mg total) by mouth 3 (three) times daily as needed for muscle spasms. Patient not taking: No sig reported 4/0/98   Delora Fuel, MD   cyclobenzaprine (FLEXERIL) 10 MG tablet Take 1 tablet (10 mg total) by mouth 2 (two) times daily as needed for muscle spasms. 06/08/21   Tegeler, Gwenyth Allegra, MD  doxycycline (VIBRAMYCIN) 100 MG capsule Take 1 capsule (100 mg total) by mouth 2 (two) times daily. 06/01/21   Orpah Greek, MD  escitalopram (LEXAPRO) 20 MG tablet escitalopram 20 mg tablet    [provider]  oxyCODONE-acetaminophen (PERCOCET/ROXICET) 5-325 MG tablet Take 1 tablet by mouth every 4 (four) hours as needed for severe pain. 06/08/21   Tegeler, Gwenyth Allegra, MD  pantoprazole (PROTONIX) 40 MG tablet Take 1 tablet (40 mg total) by mouth daily. Patient not taking: Reported on 06/07/2021 11/29/20   Sherwood Gambler, MD  traMADol (ULTRAM) 50 MG tablet Take 1 tablet (50 mg total) by mouth every 6 (six) hours as needed. 12/31/89   Delora Fuel, MD    Allergies    Dog epithelium allergy skin test, Other, and Shellfish-derived products  Review of Systems   Review of Systems Ten systems reviewed and are negative for acute change, except as noted in the HPI.   Physical Exam Updated Vital Signs BP 112/80    Pulse 75    Temp 98.2 F (36.8 C) (Oral)    Resp 18    SpO2 100%   Physical Exam Physical Exam  Constitutional: Pt is oriented to person, place, and time. Pt appears well-developed and well-nourished. No  distress.  HENT:  Head: Normocephalic and atraumatic.  Mouth/Throat: Oropharynx is clear and moist.  Eyes: Conjunctivae and EOM are normal. Pupils are equal, round, and reactive to light. No scleral icterus.  No horizontal, vertical or rotational nystagmus  Neck: Normal range of motion. Neck supple.  Full active and passive ROM without pain No midline or paraspinal tenderness No nuchal rigidity or meningeal signs  Cardiovascular: Normal rate, regular rhythm and intact distal pulses.   Pulmonary/Chest: Effort normal and breath sounds normal. No respiratory distress. Pt has no wheezes. No rales.   Abdominal: Soft. Bowel sounds are normal. There is no tenderness. There is no rebound and no guarding.  Musculoskeletal: Normal range of motion.  Lymphadenopathy:    No cervical adenopathy.  Neurological: Pt. is alert and oriented to person, place, and time. He has normal reflexes. No cranial nerve deficit.  Exhibits normal muscle tone. Coordination normal.  Mental Status:  Alert, oriented, thought content appropriate. Speech fluent without evidence of aphasia. Able to follow 2 step commands without difficulty.  Cranial Nerves:  II:  Peripheral visual fields grossly normal, pupils equal, round, reactive to light III,IV, VI: ptosis not present, extra-ocular motions intact bilaterally  V,VII: smile symmetric, facial light touch sensation equal VIII: hearing grossly normal bilaterally  IX,X: midline uvula rise  XI: bilateral shoulder shrug equal and strong XII: midline tongue extension  Motor:  5/5 in upper and lower extremities bilaterally including strong and equal grip strength and dorsiflexion/plantar flexion Sensory: Pinprick and light touch normal in all extremities.  Deep Tendon Reflexes: 2+ and symmetric  Cerebellar: normal finger-to-nose with bilateral upper extremities Gait: normal gait and balance CV: distal pulses palpable throughout   Skin: Skin is warm and dry. No rash noted. Pt is not diaphoretic.  Psychiatric: Pt has a normal mood and affect. Behavior is normal. Judgment and thought content normal.  Nursing note and vitals reviewed.  ED Results / Procedures / Treatments   Labs (all labs ordered are listed, but only abnormal results are displayed) Labs Reviewed - No data to display  EKG None  Radiology No results found.  Procedures Procedures   Medications Ordered in ED Medications  ondansetron (ZOFRAN-ODT) disintegrating tablet 4 mg (4 mg Oral Given 12/19/21 1559)  sodium chloride 0.9 % bolus 500 mL (0 mLs Intravenous Stopped 12/19/21 2035)  diphenhydrAMINE  (BENADRYL) injection 12.5 mg (12.5 mg Intravenous Given 12/19/21 1941)  metoCLOPramide (REGLAN) injection 5 mg (5 mg Intravenous Given 12/19/21 1940)  ketorolac (TORADOL) 30 MG/ML injection 15 mg (15 mg Intravenous Given 12/19/21 1941)    ED Course  I have reviewed the triage vital signs and the nursing notes.  Pertinent labs & imaging results that were available during my care of the patient were reviewed by me and considered in my medical decision making (see chart for details).    MDM Rules/Calculators/A&P   Arita Calissa Swenor presents with headache Given the large differential diagnosis for [employer], the decision making in this case is of high complexity.  Headache treated and resolved here in the ED.  After evaluating all of the data points in this case, the presentation of Leniyah Panayiota Larkin is NOT consistent with skull fracture, meningitis/encephalitis, SAH/sentinel bleed, Intracranial Hemorrhage (ICH) (subdural/epidural), acute obstructive hydrocephalus, space occupying lesions, CVA, CO Poisoning, Basilar/vertebral artery dissection, preeclampsia, cerebral venous thrombosis, hypertensive emergency, temporal Arteritis, Idiopathic Intracranial Hypertension (pseudotumor cerebri).  Strict return and follow-up precautions have been given by me personally or by detailed written instructions verbalized by  nursing staff using the teach back method to patient/family/caregiver.  Data Reviewed/Counseling: I have reviewed the patient's vital signs, nursing notes, and other relevant tests/information. I had a detailed discussion regarding the historical points, exam findings, and any diagnostic results supporting the discharge diagnosis. I also discussed the need for outpatient follow-up and the need to return to the ED if symptoms worsen or if there are any questions or concerns that arise at hom    Final Clinical Impression(s) / ED Diagnoses Final diagnoses:   Migraine without aura and without status migrainosus, not intractable    Rx / DC Orders ED Discharge Orders     None        Margarita Mail, PA-C 12/19/21 2108    Gareth Morgan, MD 12/20/21 1549

## 2021-12-19 NOTE — ED Provider Notes (Signed)
Emergency Medicine Provider Triage Evaluation Note  Carly Perez , a 30 y.o. female  was evaluated in triage.  Pt complains of throbbing headache.  She has a history of migraines.  Ran out of her Topamax.  She has had intractable migraine for the past 5 days including light sensitivity, nausea, frontal throbbing headache, sound sensitivity .  History of the same.  No changes in her headache or new neurologic deficits.  Review of Systems  Positive: Headache Negative: Vision change  Physical Exam  BP (!) 130/92 (BP Location: Left Arm)    Pulse 72    Temp 98.2 F (36.8 C) (Oral)    Resp 18    SpO2 100%  Gen:   Awake, no distress   Resp:  Normal effort  MSK:   Moves extremities without difficulty  Other:  No obvious neurodeficits  Medical Decision Making  Medically screening exam initiated at 3:56 PM.  Appropriate orders placed.  Mylena Jailynn Lavalais was informed that the remainder of the evaluation will be completed by another provider, this initial triage assessment does not replace that evaluation, and the importance of remaining in the ED until their evaluation is complete.  Zofran given   Margarita Mail, PA-C 12/19/21 1558    Lacretia Leigh, MD 12/20/21 [phone]

## 2021-12-19 NOTE — ED Triage Notes (Signed)
Pt reports a migraine that has lasted x5 days. Pt reports trying to take ibuprofen and tylenol at home w/ no relief. Pt states she ran out of her daily topamax prescription and has been experiencing bad headaches since. Reports n/v as well.

## 2021-12-19 NOTE — ED Notes (Signed)
Pt A&Ox4 ambulatory at d/c with independent steady gait

## 2022-01-03 DIAGNOSIS — R69 Illness, unspecified: Secondary | ICD-10-CM | POA: Diagnosis not present

## 2022-01-07 ENCOUNTER — Other Ambulatory Visit: Payer: Self-pay

## 2022-01-07 ENCOUNTER — Emergency Department (HOSPITAL_COMMUNITY)
Admission: EM | Admit: 2022-01-07 | Discharge: 2022-01-08 | Disposition: A | Discharge status: Home / Self Care | Payer: 59 | Attending: Emergency Medicine | Admitting: Emergency Medicine

## 2022-01-07 ENCOUNTER — Encounter (HOSPITAL_COMMUNITY): Payer: Self-pay

## 2022-01-07 DIAGNOSIS — D219 Benign neoplasm of connective and other soft tissue, unspecified: Secondary | ICD-10-CM | POA: Diagnosis not present

## 2022-01-07 DIAGNOSIS — K0889 Other specified disorders of teeth and supporting structures: Secondary | ICD-10-CM | POA: Diagnosis not present

## 2022-01-07 DIAGNOSIS — D259 Leiomyoma of uterus, unspecified: Secondary | ICD-10-CM | POA: Insufficient documentation

## 2022-01-07 DIAGNOSIS — Z79899 Other long term (current) drug therapy: Secondary | ICD-10-CM | POA: Insufficient documentation

## 2022-01-07 DIAGNOSIS — Z20822 Contact with and (suspected) exposure to covid-19: Secondary | ICD-10-CM | POA: Insufficient documentation

## 2022-01-07 DIAGNOSIS — R102 Pelvic and perineal pain: Secondary | ICD-10-CM | POA: Diagnosis not present

## 2022-01-07 LAB — I-STAT BETA HCG BLOOD, ED (MC, WL, AP ONLY): I-stat hCG, quantitative: <5 m[IU]/mL (ref <5)

## 2022-01-07 NOTE — ED Triage Notes (Signed)
Pt reports with lower abdominal pain, back pain, and left sided dental pain x 2 days. Pt states that she has pressure when she urinates.

## 2022-01-08 ENCOUNTER — Emergency Department (HOSPITAL_COMMUNITY): Payer: 59

## 2022-01-08 DIAGNOSIS — D259 Leiomyoma of uterus, unspecified: Secondary | ICD-10-CM | POA: Diagnosis not present

## 2022-01-08 DIAGNOSIS — K0889 Other specified disorders of teeth and supporting structures: Secondary | ICD-10-CM | POA: Diagnosis not present

## 2022-01-08 DIAGNOSIS — R102 Pelvic and perineal pain: Secondary | ICD-10-CM | POA: Diagnosis not present

## 2022-01-08 LAB — CBC WITH DIFFERENTIAL/PLATELET
Abs Immature Granulocytes: 0.03 10*3/uL (ref 0.00–0.07)
Basophils Absolute: 0 10*3/uL (ref 0.0–0.1)
Basophils Relative: 0 %
Eosinophils Absolute: 0.3 10*3/uL (ref 0.0–0.5)
Eosinophils Relative: 4 %
HCT: 41.5 % (ref 36.0–46.0)
Hemoglobin: 13.6 g/dL (ref 12.0–15.0)
Immature Granulocytes: 0 %
Lymphocytes Relative: 29 %
Lymphs Abs: 2.3 10*3/uL (ref 0.7–4.0)
MCH: 27.8 pg (ref 26.0–34.0)
MCHC: 32.8 g/dL (ref 30.0–36.0)
MCV: 84.9 fL (ref 80.0–100.0)
Monocytes Absolute: 0.7 10*3/uL (ref 0.1–1.0)
Monocytes Relative: 8 %
Neutro Abs: 4.7 10*3/uL (ref 1.7–7.7)
Neutrophils Relative %: 59 %
Platelets: 305 10*3/uL (ref 150–400)
RBC: 4.89 MIL/uL (ref 3.87–5.11)
RDW: 15.5 % (ref 11.5–15.5)
WBC: 8 10*3/uL (ref 4.0–10.5)
nRBC: 0 % (ref 0.0–0.2)

## 2022-01-08 LAB — URINALYSIS, ROUTINE W REFLEX MICROSCOPIC
Bilirubin Urine: NEGATIVE
Glucose, UA: NEGATIVE mg/dL
Hgb urine dipstick: NEGATIVE
Ketones, ur: NEGATIVE mg/dL
Leukocytes,Ua: NEGATIVE
Nitrite: NEGATIVE
Protein, ur: NEGATIVE mg/dL
Specific Gravity, Urine: 1.026 (ref 1.005–1.030)
pH: 5 (ref 5.0–8.0)

## 2022-01-08 LAB — RESP PANEL BY RT-PCR (FLU A&B, COVID) ARPGX2
Influenza A by PCR: NEGATIVE
Influenza B by PCR: NEGATIVE
SARS Coronavirus 2 by RT PCR: NEGATIVE

## 2022-01-08 LAB — COMPREHENSIVE METABOLIC PANEL
ALT: 16 U/L (ref 0–44)
AST: 23 U/L (ref 15–41)
Albumin: 3.7 g/dL (ref 3.5–5.0)
Alkaline Phosphatase: 60 U/L (ref 38–126)
Anion gap: 8 (ref 5–15)
BUN: 15 mg/dL (ref 6–20)
CO2: 22 mmol/L (ref 22–32)
Calcium: 8.6 mg/dL — ABNORMAL LOW (ref 8.9–10.3)
Chloride: 106 mmol/L (ref 98–111)
Creatinine, Ser: 0.75 mg/dL (ref 0.44–1.00)
GFR, Estimated: >60 mL/min (ref >60)
Glucose, Bld: 96 mg/dL (ref 70–99)
Potassium: 4 mmol/L (ref 3.5–5.1)
Sodium: 136 mmol/L (ref 135–145)
Total Bilirubin: 0.4 mg/dL (ref 0.3–1.2)
Total Protein: 7.5 g/dL (ref 6.5–8.1)

## 2022-01-08 LAB — LIPASE, BLOOD: Lipase: 30 U/L (ref 11–51)

## 2022-01-08 MED ORDER — AMOXICILLIN 500 MG PO CAPS: 500.0000 mg | ORAL_CAPSULE | Freq: Three times a day (TID) | ORAL | 0 refills | Status: AC

## 2022-01-08 MED ORDER — ONDANSETRON 8 MG PO TBDP
8.0000 mg | ORAL_TABLET | Freq: Once | ORAL | Status: AC
  Administered 2022-01-08: 8 mg via ORAL
  Filled 2022-01-08: qty 1

## 2022-01-08 MED ORDER — HYDROCODONE-ACETAMINOPHEN 5-325 MG PO TABS: 1.0000 | ORAL_TABLET | Freq: Four times a day (QID) | ORAL | 0 refills | Status: DC | PRN

## 2022-01-08 MED ORDER — OXYCODONE-ACETAMINOPHEN 5-325 MG PO TABS
1.0000 | ORAL_TABLET | Freq: Once | ORAL | Status: AC
  Administered 2022-01-08: 1 via ORAL
  Filled 2022-01-08: qty 1

## 2022-01-08 NOTE — ED Provider Notes (Signed)
[facility] DEPT Provider Note   CSN: [id number] Arrival date & time: 01/07/22  2303     History  Chief Complaint  Patient presents with   Abdominal Pain   Back Pain   Dental Pain    Carly Perez is a 31 y.o. female.  HPI Patient is a 31 year old female who presents to the emergency department due to lower abdominal pain as well as dental pain.  Patient states that she began experiencing lower abdominal pain about 3 days ago.  She has been applying warm compresses with little relief.  States her pain is constant.  Reports associated nausea and vomiting.  Denies diarrhea, constipation, vaginal bleeding, vaginal discharge.  She does note that she has a "pressure-like sensation" when urinating but denies any dysuria.  She states this symptom is due to causing her abdomen more pain when straining to urinate.  She states her last normal menstrual period was at the end of December.  Patient states that she had similar symptoms in August of last year.  Per records, patient had a CT scan at that time which showed a leiomyomata about the uterus largest on the right extending towards the right adnexa.  The also noted other small more heterogeneous areas in the uterus which are likely small leiomyomatous along the lower uterine segment.  Patient also complains of left lower dental pain that started this morning.  Denies history of smoking.  Denies any sore throat, shortness of breath, difficulty swallowing.  Denies any known drug allergies.  Home Medications Prior to Admission medications   Medication Sig Start Date End Date Taking? Authorizing Provider  amoxicillin (AMOXIL) 500 MG capsule Take 1 capsule (500 mg total) by mouth 3 (three) times daily for 10 days. 01/08/22 01/18/22 Yes Rayna Sexton, PA-C  HYDROcodone-acetaminophen (NORCO/VICODIN) 5-325 MG tablet Take 1 tablet by mouth every 6 (six) hours as needed. 01/08/22  Yes Rayna Sexton, PA-C   albuterol (PROVENTIL) (2.5 MG/3ML) 0.083% nebulizer solution albuterol sulfate 2.5 mg/3 mL (0.083 %) solution for nebulization    [provider]  albuterol (VENTOLIN HFA) 108 (90 Base) MCG/ACT inhaler Inhale 1-2 puffs into the lungs every 4 (four) hours as needed.    [provider]  cyclobenzaprine (FLEXERIL) 10 MG tablet Take 1 tablet (10 mg total) by mouth 3 (three) times daily as needed for muscle spasms. Patient not taking: No sig reported 06/02/00   Delora Fuel, MD  cyclobenzaprine (FLEXERIL) 10 MG tablet Take 1 tablet (10 mg total) by mouth 2 (two) times daily as needed for muscle spasms. 06/08/21   Tegeler, Gwenyth Allegra, MD  escitalopram (LEXAPRO) 20 MG tablet escitalopram 20 mg tablet    [provider]  pantoprazole (PROTONIX) 40 MG tablet Take 1 tablet (40 mg total) by mouth daily. Patient not taking: Reported on 06/07/2021 11/29/20   Sherwood Gambler, MD      Allergies    Dog epithelium allergy skin test, Other, and Shellfish-derived products    Review of Systems   Review of Systems  All other systems reviewed and are negative. Ten systems reviewed and are negative for acute change, except as noted in the HPI.   Physical Exam Updated Vital Signs BP 100/74 (BP Location: Right Arm)    Pulse 90    Temp 99.4 F (37.4 C) (Oral)    Resp 16    SpO2 100%  Physical Exam Vitals and nursing note reviewed.  Constitutional:      General: She is not in  acute distress.    Appearance: Normal appearance. She is not ill-appearing, toxic-appearing or diaphoretic.  HENT:     Head: Normocephalic and atraumatic.     Comments: Generally poor dentition.  Broken left lower molar with large dental carry.  Mild surrounding erythema.  No edema or fluctuance.  Moderate tenderness in the region with manipulation of the tooth.  Uvula midline.  Readily handling secretions.  Soft submental compartments.    Right Ear: External ear normal.     Left Ear: External ear normal.     Nose:  Nose normal.     Mouth/Throat:     Mouth: Mucous membranes are moist.     Pharynx: Oropharynx is clear. No oropharyngeal exudate or posterior oropharyngeal erythema.  Eyes:     Extraocular Movements: Extraocular movements intact.  Cardiovascular:     Rate and Rhythm: Normal rate and regular rhythm.     Pulses: Normal pulses.     Heart sounds: Normal heart sounds. No murmur heard.   No friction rub. No gallop.  Pulmonary:     Effort: Pulmonary effort is normal. No respiratory distress.     Breath sounds: Normal breath sounds. No stridor. No wheezing, rhonchi or rales.  Abdominal:     General: Abdomen is flat.     Palpations: Abdomen is soft.     Tenderness: There is generalized abdominal tenderness.     Comments: Abdomen is flat and soft.  Mild to moderate tenderness noted diffusely across the abdomen that appears to be worst in the lower abdomen.  Genitourinary:    Comments: Patient declined GU exam. Musculoskeletal:        General: Normal range of motion.     Cervical back: Normal range of motion and neck supple. No tenderness.  Skin:    General: Skin is warm and dry.  Neurological:     General: No focal deficit present.     Mental Status: She is alert and oriented to person, place, and time.  Psychiatric:        Mood and Affect: Mood normal.        Behavior: Behavior normal.   ED Results / Procedures / Treatments   Labs (all labs ordered are listed, but only abnormal results are displayed) Labs Reviewed  COMPREHENSIVE METABOLIC PANEL - Abnormal; Notable for the following components:      Result Value   Calcium 8.6 (*)    All other components within normal limits  RESP PANEL BY RT-PCR (FLU A&B, COVID) ARPGX2  CBC WITH DIFFERENTIAL/PLATELET  LIPASE, BLOOD  URINALYSIS, ROUTINE W REFLEX MICROSCOPIC  I-STAT BETA HCG BLOOD, ED ([facility], [facility], [facility] ONLY)   EKG None  Radiology US Transvaginal Non-OB  Result Date: 01/08/2022 CLINICAL DATA:  Pelvic pain EXAM: TRANSABDOMINAL AND  TRANSVAGINAL ULTRASOUND OF PELVIS DOPPLER ULTRASOUND OF OVARIES TECHNIQUE: Both transabdominal and transvaginal ultrasound examinations of the pelvis were performed. Transabdominal technique was performed for global imaging of the pelvis including uterus, ovaries, adnexal regions, and pelvic cul-de-sac. It was necessary to proceed with endovaginal exam following the transabdominal exam to visualize the uterus, endometrium, ovaries and adnexa. Color and duplex Doppler ultrasound was utilized to evaluate blood flow to the ovaries. COMPARISON:  08/15/2021 CT FINDINGS: Uterus Measurements: 10.3 x 7.6 x 8.8 cm = volume: 360 mL. Central 4.3 cm fibroid. Posterior right fundal fibroid measuring up to 5.5 cm. Anterior left fundal fibroid measuring up to 4.2 cm. Endometrium Thickness: 11 mm in thickness where visualized. No focal abnormality visualized. Right ovary  Measurements: Not visualized.  No adnexal mass seen. Left ovary Measurements: 3.0 x 1.6 x 1.9 cm = volume: 4.7 mL. Limited visualization. No visible adnexal mass. Pulsed Doppler evaluation of both ovaries demonstrates normal low-resistance arterial and venous waveforms. Other findings No abnormal free fluid. IMPRESSION: Enlarged fibroid uterus. No acute findings. Electronically Signed   By: Rolm Baptise M.D.   On: 01/08/2022 03:03   US Pelvis Complete  Result Date: 01/08/2022 CLINICAL DATA:  Pelvic pain EXAM: TRANSABDOMINAL AND TRANSVAGINAL ULTRASOUND OF PELVIS DOPPLER ULTRASOUND OF OVARIES TECHNIQUE: Both transabdominal and transvaginal ultrasound examinations of the pelvis were performed. Transabdominal technique was performed for global imaging of the pelvis including uterus, ovaries, adnexal regions, and pelvic cul-de-sac. It was necessary to proceed with endovaginal exam following the transabdominal exam to visualize the uterus, endometrium, ovaries and adnexa. Color and duplex Doppler ultrasound was utilized to evaluate blood flow to the ovaries.  COMPARISON:  08/15/2021 CT FINDINGS: Uterus Measurements: 10.3 x 7.6 x 8.8 cm = volume: 360 mL. Central 4.3 cm fibroid. Posterior right fundal fibroid measuring up to 5.5 cm. Anterior left fundal fibroid measuring up to 4.2 cm. Endometrium Thickness: 11 mm in thickness where visualized. No focal abnormality visualized. Right ovary Measurements: Not visualized.  No adnexal mass seen. Left ovary Measurements: 3.0 x 1.6 x 1.9 cm = volume: 4.7 mL. Limited visualization. No visible adnexal mass. Pulsed Doppler evaluation of both ovaries demonstrates normal low-resistance arterial and venous waveforms. Other findings No abnormal free fluid. IMPRESSION: Enlarged fibroid uterus. No acute findings. Electronically Signed   By: Rolm Baptise M.D.   On: 01/08/2022 03:03   [place] Art/Ven Flow Abd Pelv Doppler  Result Date: 01/08/2022 CLINICAL DATA:  Pelvic pain EXAM: TRANSABDOMINAL AND TRANSVAGINAL ULTRASOUND OF PELVIS DOPPLER ULTRASOUND OF OVARIES TECHNIQUE: Both transabdominal and transvaginal ultrasound examinations of the pelvis were performed. Transabdominal technique was performed for global imaging of the pelvis including uterus, ovaries, adnexal regions, and pelvic cul-de-sac. It was necessary to proceed with endovaginal exam following the transabdominal exam to visualize the uterus, endometrium, ovaries and adnexa. Color and duplex Doppler ultrasound was utilized to evaluate blood flow to the ovaries. COMPARISON:  08/15/2021 CT FINDINGS: Uterus Measurements: 10.3 x 7.6 x 8.8 cm = volume: 360 mL. Central 4.3 cm fibroid. Posterior right fundal fibroid measuring up to 5.5 cm. Anterior left fundal fibroid measuring up to 4.2 cm. Endometrium Thickness: 11 mm in thickness where visualized. No focal abnormality visualized. Right ovary Measurements: Not visualized.  No adnexal mass seen. Left ovary Measurements: 3.0 x 1.6 x 1.9 cm = volume: 4.7 mL. Limited visualization. No visible adnexal mass. Pulsed Doppler evaluation of both  ovaries demonstrates normal low-resistance arterial and venous waveforms. Other findings No abnormal free fluid. IMPRESSION: Enlarged fibroid uterus. No acute findings. Electronically Signed   By: Rolm Baptise M.D.   On: 01/08/2022 03:03    Procedures Procedures    Medications Ordered in ED Medications  oxyCODONE-acetaminophen (PERCOCET/ROXICET) 5-325 MG per tablet 1 tablet (1 tablet Oral Given 01/08/22 0148)  ondansetron (ZOFRAN-ODT) disintegrating tablet 8 mg (8 mg Oral Given 01/08/22 0148)   ED Course/ Medical Decision Making/ A&P                           Medical Decision Making Pt is a 31 y.o. female who presents to the emergency department due to pelvic pain as well as dental pain.  Labs: CBC without abnormalities. CMP with a calcium of 8.6.  Urinalysis is within normal limits. Lipase within normal limits at 30. I-STAT beta-hCG less than 5. Respiratory panel is pending.  Imaging: Pelvic ultrasound shows enlarged fibroid uterus.  Otherwise no acute findings.  I, Rayna Sexton, PA-C, personally reviewed and evaluated these images and lab results as part of my medical decision-making.  Patient presents today with multiple complaints.  Patient notes that she has been experiencing lower abdominal pain for the past 3 days.  Seen with similar complaints in August of last year and had a CT scan obtained showing leiomyomatous.  Patient states that over the past few days her symptoms began once again.  She states that they are similar to her symptoms last year but worse.  On my exam abdomen is soft.  Patient has tenderness diffusely in the abdomen that appears to be worst in the lower abdomen.  Given her recent CT scan I obtained a pelvic ultrasound which showed an enlarged fibroid uterus but otherwise no acute findings.  Patient declined a pelvic exam.  Denies any urinary complaints or vaginal discharge.  Denies any concern for STI or any new sexual partners.  CBC without leukocytosis.   Low-grade temperature of 99.6 F.  Nontachycardic.  Doubt infectious etiology.  CMP with a calcium of 8.6 but otherwise no electrolyte derangements.  UA is within normal limits.  Lipase within normal limits.  Pregnancy test is negative.  Recommended that patient follow-up with gynecology.  Patient also with a broken and painful left lower molar.  There are some mild erythema surrounding the tooth but no significant edema or fluctuance.  Uvula midline.  Handling secretions.  Soft compartments.  Doubt PTA or Ludwig's angina at this time.  Will discharge on a course of amoxicillin.  Patient denies any known drug allergies.  Patient given a referral to a local dentist.  Recommended that she reach out to them as soon as possible to schedule an appointment for reevaluation.  Patient given a dose of Percocet as well as Zofran in the ED.  Notes moderate improvement in her symptoms.  Feel that she is stable for discharge at this time and she is agreeable.  Will discharge on a course of Vicodin.  We discussed safety regarding this medication.  Discussed return precautions.  Her questions were answered and she was amicable at the time of discharge.  Note: Portions of this report may have been transcribed using voice recognition software. Every effort was made to ensure accuracy; however, inadvertent computerized transcription errors may be present.   Final Clinical Impression(s) / ED Diagnoses Final diagnoses:  Pain, dental  Fibroids  Pelvic pain   Rx / DC Orders ED Discharge Orders          Ordered    amoxicillin (AMOXIL) 500 MG capsule  3 times daily        01/08/22 0334    HYDROcodone-acetaminophen (NORCO/VICODIN) 5-325 MG tablet  Every 6 hours PRN        01/08/22 0334              Rayna Sexton, PA-C 01/08/22 0353    Regan Lemming, MD 01/08/22 [phone]

## 2022-01-08 NOTE — Discharge Instructions (Addendum)
I am prescribing you an antibiotic called amoxicillin.  Please take this 3 times per day for the next 10 days.  Do not stop taking this early.  Below is the contact information for Dr. Geralynn Ochs.  He is a Database administrator.  Please give them a call at your convenience to schedule an appointment for reevaluation.  I have prescribed you a strong narcotic called Vicodin. Please only take this as prescribed. This medication also has tylenol in it, so please be sure you are not taking more than 3000 mg of tylenol per day. Do not drive or operate heavy machinery after taking this medication. Do not mix it with alcohol.   I would also recommend following up with your gynecologist regarding your pelvic pain.  Please continue to monitor your symptoms closely.  If you develop any new or worsening symptoms please come back to the emergency department.

## 2022-12-28 ENCOUNTER — Encounter (HOSPITAL_COMMUNITY): Payer: Self-pay

## 2022-12-28 ENCOUNTER — Other Ambulatory Visit: Payer: Self-pay

## 2022-12-28 ENCOUNTER — Emergency Department (HOSPITAL_COMMUNITY)
Admission: EM | Admit: 2022-12-28 | Discharge: 2022-12-29 | Disposition: A | Discharge status: Home / Self Care | Payer: Medicaid Other | Attending: Emergency Medicine | Admitting: Emergency Medicine

## 2022-12-28 DIAGNOSIS — R1110 Vomiting, unspecified: Secondary | ICD-10-CM | POA: Insufficient documentation

## 2022-12-28 DIAGNOSIS — R059 Cough, unspecified: Secondary | ICD-10-CM | POA: Diagnosis not present

## 2022-12-28 DIAGNOSIS — Z20822 Contact with and (suspected) exposure to covid-19: Secondary | ICD-10-CM | POA: Diagnosis not present

## 2022-12-28 DIAGNOSIS — R197 Diarrhea, unspecified: Secondary | ICD-10-CM | POA: Insufficient documentation

## 2022-12-28 LAB — RESP PANEL BY RT-PCR (RSV, FLU A&B, COVID)  RVPGX2
Influenza A by PCR: NEGATIVE
Influenza B by PCR: NEGATIVE
Resp Syncytial Virus by PCR: NEGATIVE
SARS Coronavirus 2 by RT PCR: NEGATIVE

## 2022-12-28 MED ORDER — ONDANSETRON 8 MG PO TBDP
8.0000 mg | ORAL_TABLET | Freq: Once | ORAL | Status: AC
  Administered 2022-12-28: 8 mg via ORAL
  Filled 2022-12-28: qty 1

## 2022-12-28 MED ORDER — ACETAMINOPHEN 325 MG PO TABS
650.0000 mg | ORAL_TABLET | Freq: Once | ORAL | Status: AC
  Administered 2022-12-29: 650 mg via ORAL
  Filled 2022-12-28: qty 2

## 2022-12-28 MED ORDER — ONDANSETRON 8 MG PO TBDP: 8.0000 mg | ORAL_TABLET | Freq: Three times a day (TID) | ORAL | 0 refills | Status: DC | PRN

## 2022-12-28 NOTE — Discharge Instructions (Addendum)

## 2022-12-28 NOTE — ED Triage Notes (Signed)
Generalized body aches and chills since Monday. Request covid/ flu eval.

## 2022-12-28 NOTE — ED Provider Notes (Signed)
[facility] DEPT Provider Note   CSN: [id number] Arrival date & time: 12/28/22  2130     History  Chief Complaint  Patient presents with   Generalized Body Aches    Carly Perez is a 31 y.o. female.  The history is provided by the patient.  Patient is otherwise healthy presenting with viral illness.  The past 2 days she has had cough, congestion, chills, vomiting and diarrhea.  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 No active chest pain or shortness of breath She reports mild abdominal cramping.    Home Medications Prior to Admission medications   Medication Sig Start Date End Date Taking? Authorizing Provider  ondansetron (ZOFRAN-ODT) 8 MG disintegrating tablet Take 1 tablet (8 mg total) by mouth every 8 (eight) hours as needed. 12/28/22  Yes Ripley Fraise, MD  albuterol (PROVENTIL) (2.5 MG/3ML) 0.083% nebulizer solution albuterol sulfate 2.5 mg/3 mL (0.083 %) solution for nebulization    [provider]  albuterol (VENTOLIN HFA) 108 (90 Base) MCG/ACT inhaler Inhale 1-2 puffs into the lungs every 4 (four) hours as needed.    [provider]  escitalopram (LEXAPRO) 20 MG tablet escitalopram 20 mg tablet    [provider]      Allergies    Dog epithelium allergy skin test, Other, and Shellfish-derived products    Review of Systems   Review of Systems  Respiratory:  Positive for cough.   Gastrointestinal:  Positive for diarrhea, nausea and vomiting. Negative for blood in stool.    Physical Exam Updated Vital Signs BP 115/87   Pulse 72   Temp 97.7 F (36.5 C) (Oral)   Resp 17   Ht 1.6 m ('5\' 3"'$ )   Wt 117.9 kg   SpO2 98%   BMI 46.06 kg/m  Physical Exam CONSTITUTIONAL: Well developed/well nourished HEAD: Normocephalic/atraumatic EYES: EOMI/PERRL ENMT: Mucous membranes moist NECK: supple no meningeal signs CV: S1/S2 noted, no murmurs/rubs/gallops  noted LUNGS: Lungs are clear to auscultation bilaterally, no apparent distress ABDOMEN: soft, nontender NEURO: Pt is awake/alert/appropriate, moves all extremitiesx4.  No facial droop.   EXTREMITIES: pulses normal/equal, full ROM SKIN: warm, color normal PSYCH: no abnormalities of mood noted, alert and oriented to situation  ED Results / Procedures / Treatments   Labs (all labs ordered are listed, but only abnormal results are displayed) Labs Reviewed  RESP PANEL BY RT-PCR (RSV, FLU A&B, COVID)  RVPGX2    EKG None  Radiology No results found.  Procedures Procedures    Medications Ordered in ED Medications  ondansetron (ZOFRAN-ODT) disintegrating tablet 8 mg (has no administration in time range)    ED Course/ Medical Decision Making/ A&P                           Medical Decision Making Risk Prescription drug management.   Patient well-appearing, no distress, appears hydrated.  No focal abdominal tenderness.  No added lung sounds.  Vitals appropriate.  Suspect viral illness.  Will discharge home        Final Clinical Impression(s) / ED Diagnoses Final diagnoses:  Vomiting and diarrhea    Rx / DC Orders ED Discharge Orders          Ordered    ondansetron (ZOFRAN-ODT) 8 MG disintegrating tablet  Every 8 hours PRN        12/28/22 2338  Ripley Fraise, MD 12/28/22 [phone]

## 2023-01-22 ENCOUNTER — Telehealth: Payer: Self-pay | Admitting: Physician Assistant

## 2023-01-22 DIAGNOSIS — R0602 Shortness of breath: Secondary | ICD-10-CM

## 2023-01-22 NOTE — Progress Notes (Signed)
Virtual Visit Consent   [name], you are scheduled for a virtual visit with a [facility] provider today. Just as with appointments in the office, your consent must be obtained to participate. Your consent will be active for this visit and any virtual visit you may have with one of our providers in the next 365 days. If you have a MyChart account, a copy of this consent can be sent to you electronically.  As this is a virtual visit, video technology does not allow for your provider to perform a traditional examination. This may limit your provider's ability to fully assess your condition. If your provider identifies any concerns that need to be evaluated in person or the need to arrange testing (such as labs, EKG, etc.), we will make arrangements to do so. Although advances in technology are sophisticated, we cannot ensure that it will always work on either your end or our end. If the connection with a video visit is poor, the visit may have to be switched to a telephone visit. With either a video or telephone visit, we are not always able to ensure that we have a secure connection.  By engaging in this virtual visit, you consent to the provision of healthcare and authorize for your insurance to be billed (if applicable) for the services provided during this visit. Depending on your insurance coverage, you may receive a charge related to this service.  I need to obtain your verbal consent now. Are you willing to proceed with your visit today? Carly Perez has provided verbal consent on 01/22/2023 for a virtual visit (video or telephone). Lenise Arena Ward, PA-C  Date: 01/22/2023 6:06 PM  Virtual Visit via Video Note   I, Lenise Arena Ward, connected with  Carly Perez  ([id number], December 16, 1991) on 01/22/23 at  6:00 PM EST by a video-enabled telemedicine application and verified that I am speaking with the correct person using two identifiers.  Location: Patient:  Virtual Visit Location Patient: Home Provider: Virtual Visit Location Provider: Home Office   I discussed the limitations of evaluation and management by telemedicine and the availability of in person appointments. The patient expressed understanding and agreed to proceed.    History of Present Illness: Carly Perez is a 32 y.o. who identifies as a female who was assigned female at birth, and is being seen today for shortness of breath and wheezing.  Reports she tested positive for COVID earlier today.  She reports sx started yesterday.  She has a h/o asthma and reports she is out of her rescue inhaler.  She reports chest tightness, body aches, fever, chills.  HPI: HPI  Problems: There are no problems to display for this patient.   Allergies:  Allergies  Allergen Reactions   Dog Epithelium Allergy Skin Test Hives   Other Hives and Other (See Comments)   Shellfish-Derived Products Hives   Medications:  Current Outpatient Medications:    albuterol (PROVENTIL) (2.5 MG/3ML) 0.083% nebulizer solution, albuterol sulfate 2.5 mg/3 mL (0.083 %) solution for nebulization, Disp: , Rfl:    albuterol (VENTOLIN HFA) 108 (90 Base) MCG/ACT inhaler, Inhale 1-2 puffs into the lungs every 4 (four) hours as needed., Disp: , Rfl:    escitalopram (LEXAPRO) 20 MG tablet, escitalopram 20 mg tablet, Disp: , Rfl:    ondansetron (ZOFRAN-ODT) 8 MG disintegrating tablet, Take 1 tablet (8 mg total) by mouth every 8 (eight) hours as needed., Disp: 8 tablet, Rfl: 0  Observations/Objective: Patient is well-developed, well-nourished  in no acute distress.  Resting comfortably  at home.  Head is normocephalic, atraumatic.   Speech is clear and coherent with logical content.  Patient is alert and oriented at baseline.  Breathing is labored  Assessment and Plan: 1. Shortness of breath  Advised pt to go to an urgent care facility or the ED due to shortness of breath, wheezing, and chest tightness.    Follow Up Instructions: I discussed the assessment and treatment plan with the patient. The patient was provided an opportunity to ask questions and all were answered. The patient agreed with the plan and demonstrated an understanding of the instructions.  A copy of instructions were sent to the patient via MyChart unless otherwise noted below.     The patient was advised to call back or seek an in-person evaluation if the symptoms worsen or if the condition fails to improve as anticipated.  Time:  I spent 12 minutes with the patient via telehealth technology discussing the above problems/concerns.    Lenise Arena Ward, PA-C

## 2023-01-22 NOTE — Patient Instructions (Signed)
  [name], thank you for joining [place], PA-C for today's virtual visit.  While this provider is not your primary care provider (PCP), if your PCP is located in our provider database this encounter information will be shared with them immediately following your visit.   [facility] account gives you access to today's visit and all your visits, tests, and labs performed at [facility] " click here if you don't have a [facility] account or go to [email or web address]  Consent: (Patient) Carly Perez provided verbal consent for this virtual visit at the beginning of the encounter.  Current Medications:  Current Outpatient Medications:    albuterol (PROVENTIL) (2.5 MG/3ML) 0.083% nebulizer solution, albuterol sulfate 2.5 mg/3 mL (0.083 %) solution for nebulization, Disp: , Rfl:    albuterol (VENTOLIN HFA) 108 (90 Base) MCG/ACT inhaler, Inhale 1-2 puffs into the lungs every 4 (four) hours as needed., Disp: , Rfl:    escitalopram (LEXAPRO) 20 MG tablet, escitalopram 20 mg tablet, Disp: , Rfl:    ondansetron (ZOFRAN-ODT) 8 MG disintegrating tablet, Take 1 tablet (8 mg total) by mouth every 8 (eight) hours as needed., Disp: 8 tablet, Rfl: 0   Medications ordered in this encounter:  No orders of the defined types were placed in this encounter.    *If you need refills on other medications prior to your next appointment, please contact your pharmacy*  [place] [phone]  Other Instructions  Recommend in person evaluation today in Urgent Care or Emergency Department   If you have been instructed to have an in-person evaluation today at a local Urgent Care facility, please use the link below. It will take you to a list of all of our available [facility], including address, phone number and hours of operation. Please do not delay care.  [facility]  If you or a family member do  not have a primary care provider, use the link below to schedule a visit and establish care. When you choose a [facility] primary care physician or advanced practice provider, you gain a long-term partner in health. Find a Primary Care Provider  Learn more about 's in-office and virtual care options: [facility]

## 2023-02-04 ENCOUNTER — Emergency Department (HOSPITAL_COMMUNITY)
Admission: EM | Admit: 2023-02-04 | Discharge: 2023-02-04 | Disposition: A | Discharge status: Home / Self Care | Payer: 59 | Attending: Emergency Medicine | Admitting: Emergency Medicine

## 2023-02-04 ENCOUNTER — Encounter (HOSPITAL_COMMUNITY): Payer: Self-pay | Admitting: Emergency Medicine

## 2023-02-04 ENCOUNTER — Emergency Department (HOSPITAL_COMMUNITY): Payer: 59

## 2023-02-04 DIAGNOSIS — J45909 Unspecified asthma, uncomplicated: Secondary | ICD-10-CM | POA: Diagnosis not present

## 2023-02-04 DIAGNOSIS — Z79899 Other long term (current) drug therapy: Secondary | ICD-10-CM | POA: Insufficient documentation

## 2023-02-04 DIAGNOSIS — J069 Acute upper respiratory infection, unspecified: Secondary | ICD-10-CM | POA: Diagnosis not present

## 2023-02-04 DIAGNOSIS — G4733 Obstructive sleep apnea (adult) (pediatric): Secondary | ICD-10-CM | POA: Diagnosis not present

## 2023-02-04 DIAGNOSIS — R0789 Other chest pain: Secondary | ICD-10-CM | POA: Diagnosis not present

## 2023-02-04 DIAGNOSIS — R079 Chest pain, unspecified: Secondary | ICD-10-CM | POA: Diagnosis not present

## 2023-02-04 DIAGNOSIS — R059 Cough, unspecified: Secondary | ICD-10-CM | POA: Diagnosis not present

## 2023-02-04 LAB — CBC WITH DIFFERENTIAL/PLATELET
Abs Immature Granulocytes: 0.06 10*3/uL (ref 0.00–0.07)
Basophils Absolute: 0 10*3/uL (ref 0.0–0.1)
Basophils Relative: 0 %
Eosinophils Absolute: 0.2 10*3/uL (ref 0.0–0.5)
Eosinophils Relative: 3 %
HCT: 41.8 % (ref 36.0–46.0)
Hemoglobin: 13.2 g/dL (ref 12.0–15.0)
Immature Granulocytes: 1 %
Lymphocytes Relative: 29 %
Lymphs Abs: 2.2 10*3/uL (ref 0.7–4.0)
MCH: 27.6 pg (ref 26.0–34.0)
MCHC: 31.6 g/dL (ref 30.0–36.0)
MCV: 87.3 fL (ref 80.0–100.0)
Monocytes Absolute: 0.6 10*3/uL (ref 0.1–1.0)
Monocytes Relative: 8 %
Neutro Abs: 4.3 10*3/uL (ref 1.7–7.7)
Neutrophils Relative %: 59 %
Platelets: 296 10*3/uL (ref 150–400)
RBC: 4.79 MIL/uL (ref 3.87–5.11)
RDW: 14.1 % (ref 11.5–15.5)
WBC: 7.4 10*3/uL (ref 4.0–10.5)
nRBC: 0 % (ref 0.0–0.2)

## 2023-02-04 LAB — TROPONIN I (HIGH SENSITIVITY): Troponin I (High Sensitivity): <2 ng/L (ref <18)

## 2023-02-04 LAB — URINALYSIS, ROUTINE W REFLEX MICROSCOPIC
Bilirubin Urine: NEGATIVE
Glucose, UA: NEGATIVE mg/dL
Hgb urine dipstick: NEGATIVE
Ketones, ur: NEGATIVE mg/dL
Leukocytes,Ua: NEGATIVE
Nitrite: NEGATIVE
Protein, ur: NEGATIVE mg/dL
Specific Gravity, Urine: 1.026 (ref 1.005–1.030)
pH: 6 (ref 5.0–8.0)

## 2023-02-04 LAB — COMPREHENSIVE METABOLIC PANEL
ALT: 28 U/L (ref 0–44)
AST: 28 U/L (ref 15–41)
Albumin: 3.8 g/dL (ref 3.5–5.0)
Alkaline Phosphatase: 60 U/L (ref 38–126)
Anion gap: 8 (ref 5–15)
BUN: 8 mg/dL (ref 6–20)
CO2: 26 mmol/L (ref 22–32)
Calcium: 8.8 mg/dL — ABNORMAL LOW (ref 8.9–10.3)
Chloride: 105 mmol/L (ref 98–111)
Creatinine, Ser: 0.58 mg/dL (ref 0.44–1.00)
GFR, Estimated: >60 mL/min (ref >60)
Glucose, Bld: 82 mg/dL (ref 70–99)
Potassium: 3.6 mmol/L (ref 3.5–5.1)
Sodium: 139 mmol/L (ref 135–145)
Total Bilirubin: 0.5 mg/dL (ref 0.3–1.2)
Total Protein: 7.8 g/dL (ref 6.5–8.1)

## 2023-02-04 LAB — LIPASE, BLOOD: Lipase: 34 U/L (ref 11–51)

## 2023-02-04 LAB — PREGNANCY, URINE: Preg Test, Ur: NEGATIVE

## 2023-02-04 MED ORDER — MAGNESIUM SULFATE 2 GM/50ML IV SOLN
2.0000 g | Freq: Once | INTRAVENOUS | Status: AC
  Administered 2023-02-04: 2 g via INTRAVENOUS
  Filled 2023-02-04: qty 50

## 2023-02-04 MED ORDER — OXYMETAZOLINE HCL 0.05 % NA SOLN: 1.0000 | Freq: Two times a day (BID) | NASAL | 0 refills | Status: AC

## 2023-02-04 MED ORDER — LIDOCAINE VISCOUS HCL 2 % MT SOLN
15.0000 mL | Freq: Once | OROMUCOSAL | Status: AC
  Administered 2023-02-04: 15 mL via ORAL
  Filled 2023-02-04: qty 15

## 2023-02-04 MED ORDER — GUAIFENESIN-DM 100-10 MG/5ML PO SYRP
5.0000 mL | ORAL_SOLUTION | ORAL | 0 refills | Status: DC | PRN
Start: 2023-02-04 — End: 2023-03-15

## 2023-02-04 MED ORDER — ONDANSETRON HCL 4 MG PO TABS: 4.0000 mg | ORAL_TABLET | Freq: Three times a day (TID) | ORAL | 0 refills | Status: AC | PRN

## 2023-02-04 MED ORDER — SODIUM CHLORIDE 0.9 % IV BOLUS
1000.0000 mL | Freq: Once | INTRAVENOUS | Status: AC
  Administered 2023-02-04: 1000 mL via INTRAVENOUS

## 2023-02-04 MED ORDER — ALUM & MAG HYDROXIDE-SIMETH 200-200-20 MG/5ML PO SUSP
30.0000 mL | Freq: Once | ORAL | Status: AC
  Administered 2023-02-04: 30 mL via ORAL
  Filled 2023-02-04: qty 30

## 2023-02-04 MED ORDER — ACETAMINOPHEN 500 MG PO TABS
1000.0000 mg | ORAL_TABLET | Freq: Once | ORAL | Status: AC
  Administered 2023-02-04: 1000 mg via ORAL
  Filled 2023-02-04: qty 2

## 2023-02-04 MED ORDER — KETOROLAC TROMETHAMINE 30 MG/ML IJ SOLN
30.0000 mg | Freq: Once | INTRAMUSCULAR | Status: AC
Start: 2023-02-04 — End: 2023-02-04
  Administered 2023-02-04: 30 mg via INTRAVENOUS
  Filled 2023-02-04: qty 1

## 2023-02-04 MED ORDER — SUCRALFATE 1 G PO TABS: 1.0000 g | ORAL_TABLET | Freq: Three times a day (TID) | ORAL | 0 refills | Status: DC

## 2023-02-04 MED ORDER — ALBUTEROL SULFATE HFA 108 (90 BASE) MCG/ACT IN AERS: 1.0000 | INHALATION_SPRAY | Freq: Four times a day (QID) | RESPIRATORY_TRACT | 0 refills | Status: DC | PRN

## 2023-02-04 MED ORDER — DIPHENHYDRAMINE HCL 50 MG/ML IJ SOLN
25.0000 mg | Freq: Once | INTRAMUSCULAR | Status: AC
  Administered 2023-02-04: 25 mg via INTRAVENOUS
  Filled 2023-02-04: qty 1

## 2023-02-04 MED ORDER — KETOROLAC TROMETHAMINE 15 MG/ML IJ SOLN: 15.0000 mg | Freq: Once | INTRAMUSCULAR | Status: DC

## 2023-02-04 MED ORDER — PROMETHAZINE HCL 12.5 MG PO TABS: 12.5000 mg | ORAL_TABLET | Freq: Four times a day (QID) | ORAL | 0 refills | Status: AC | PRN

## 2023-02-04 MED ORDER — SODIUM CHLORIDE 0.9 % IV SOLN
25.0000 mg | Freq: Once | INTRAVENOUS | Status: AC
  Administered 2023-02-04: 25 mg via INTRAVENOUS
  Filled 2023-02-04: qty 25

## 2023-02-04 NOTE — ED Provider Notes (Signed)
[facility] Provider Note  CSN: [id number] Arrival date & time: 02/04/23 4008  Chief Complaint(s) Chest Pain  HPI Carly Perez is a 32 y.o. female with past medical history as below, significant for asthma, OSA, recurrent headaches, prior tonsillectomy who presents to the ED with complaint of headache, body aches, abdominal pain, nausea, epigastric burning sensation.  Patient reports she was diagnosed with COVID approximately 10 days ago.  Symptoms worsened over the past couple days, she tried Tylenol x 1 yesterday which did not alleviate her symptoms.  Headache feels similar to prior migraine type headaches described as throbbing and pulsing back or neck to the forehead area.  Light sensitivity and sound sensitivity reported.  No pain with neck movement.  No IV drug use, no change in bowel or bladder function.  Past Medical History Past Medical History:  Diagnosis Date   Asthma    Gout    Sleep apnea    There are no problems to display for this patient.  Home Medication(s) Prior to Admission medications   Medication Sig Start Date End Date Taking? Authorizing Provider  albuterol (VENTOLIN HFA) 108 (90 Base) MCG/ACT inhaler Inhale 1-2 puffs into the lungs every 6 (six) hours as needed for wheezing or shortness of breath. 02/04/23  Yes Wynona Dove A, DO  guaiFENesin-dextromethorphan (ROBITUSSIN DM) 100-10 MG/5ML syrup Take 5 mLs by mouth every 4 (four) hours as needed for cough. 02/04/23  Yes Wynona Dove A, DO  ondansetron (ZOFRAN) 4 MG tablet Take 1 tablet (4 mg total) by mouth every 8 (eight) hours as needed for nausea or vomiting. 02/04/23  Yes Jeanell Sparrow, DO  oxymetazoline (AFRIN NASAL SPRAY) 0.05 % nasal spray Place 1 spray into both nostrils 2 (two) times daily for 3 days. 02/04/23 02/07/23 Yes Jeanell Sparrow, DO  promethazine (PHENERGAN) 12.5 MG tablet Take 1 tablet (12.5 mg total) by mouth every 6 (six) hours as needed  (headache). 02/04/23  Yes Wynona Dove A, DO  sucralfate (CARAFATE) 1 g tablet Take 1 tablet (1 g total) by mouth with breakfast, with lunch, and with evening meal for 7 days. 02/04/23 02/11/23 Yes Wynona Dove A, DO  albuterol (PROVENTIL) (2.5 MG/3ML) 0.083% nebulizer solution albuterol sulfate 2.5 mg/3 mL (0.083 %) solution for nebulization    [provider]  albuterol (VENTOLIN HFA) 108 (90 Base) MCG/ACT inhaler Inhale 1-2 puffs into the lungs every 4 (four) hours as needed.    [provider]  escitalopram (LEXAPRO) 20 MG tablet escitalopram 20 mg tablet    [provider]  ondansetron (ZOFRAN-ODT) 8 MG disintegrating tablet Take 1 tablet (8 mg total) by mouth every 8 (eight) hours as needed. 12/28/22   Ripley Fraise, MD  Past Surgical History Past Surgical History:  Procedure Laterality Date   CYST EXCISION     TONSILLECTOMY     Family History History reviewed. No pertinent family history.  Social History Social History   Tobacco Use   Smoking status: Never   Smokeless tobacco: Never  Vaping Use   Vaping Use: Never used  Substance Use Topics   Alcohol use: Never   Drug use: Never   Allergies Dog epithelium allergy skin test, Other, and Shellfish-derived products  Review of Systems Review of Systems  Constitutional:  Positive for fatigue. Negative for activity change and fever.  HENT:  Negative for facial swelling and trouble swallowing.   Eyes:  Negative for discharge and redness.  Respiratory:  Negative for cough and shortness of breath.   Cardiovascular:  Negative for chest pain and palpitations.  Gastrointestinal:  Positive for abdominal pain and nausea.  Genitourinary:  Negative for dysuria and flank pain.  Musculoskeletal:  Positive for arthralgias. Negative for back pain and gait problem.  Skin:  Negative for  pallor and rash.  Neurological:  Positive for headaches. Negative for syncope.    Physical Exam Vital Signs  I have reviewed the triage vital signs BP 136/87   Pulse 80   Temp 98 F (36.7 C) (Oral)   Resp 16   Ht '5\' 3"'$  (1.6 m)   Wt 117.9 kg   SpO2 99%   BMI 46.06 kg/m  Physical Exam Vitals and nursing note reviewed.  Constitutional:      General: She is not in acute distress.    Appearance: Normal appearance. She is not ill-appearing.  HENT:     Head: Normocephalic and atraumatic. No raccoon eyes, Battle's sign, right periorbital erythema or left periorbital erythema.     Jaw: There is normal jaw occlusion. No trismus.     Right Ear: External ear normal.     Left Ear: External ear normal.     Nose: Nose normal.     Mouth/Throat:     Mouth: Mucous membranes are moist.  Eyes:     General: No scleral icterus.       Right eye: No discharge.        Left eye: No discharge.  Cardiovascular:     Rate and Rhythm: Normal rate and regular rhythm.     Pulses: Normal pulses.     Heart sounds: Normal heart sounds.  Pulmonary:     Effort: Pulmonary effort is normal. No respiratory distress.     Breath sounds: Normal breath sounds.  Abdominal:     General: Abdomen is flat.     Palpations: Abdomen is soft.     Tenderness: There is no abdominal tenderness.  Musculoskeletal:     Cervical back: No rigidity.     Right lower leg: No edema.     Left lower leg: No edema.  Skin:    General: Skin is warm and dry.     Capillary Refill: Capillary refill takes less than 2 seconds.  Neurological:     Mental Status: She is alert and oriented to person, place, and time.     GCS: GCS eye subscore is 4. GCS verbal subscore is 5. GCS motor subscore is 6.     Cranial Nerves: Cranial nerves 2-12 are intact.     Sensory: Sensation is intact.     Motor: Motor function is intact.     Coordination: Coordination is intact.     Comments: Strength 5/5 BLUE BLLE  Psychiatric:  Mood and Affect:  Mood normal.        Behavior: Behavior normal.     ED Results and Treatments Labs (all labs ordered are listed, but only abnormal results are displayed) Labs Reviewed  COMPREHENSIVE METABOLIC PANEL - Abnormal; Notable for the following components:      Result Value   Calcium 8.8 (*)    All other components within normal limits  URINALYSIS, ROUTINE W REFLEX MICROSCOPIC - Abnormal; Notable for the following components:   APPearance HAZY (*)    All other components within normal limits  CBC WITH DIFFERENTIAL/PLATELET  LIPASE, BLOOD  PREGNANCY, URINE  TROPONIN I (HIGH SENSITIVITY)                                                                                                                          Radiology DG Chest Portable 1 View  Result Date: 02/04/2023 CLINICAL DATA:  Cough, chest pain.  Recent diagnosis of COVID. EXAM: PORTABLE CHEST 1 VIEW COMPARISON:  Prior chest x-ray 06/08/2021 FINDINGS: The heart size and mediastinal contours are within normal limits. Both lungs are clear. The visualized skeletal structures are unremarkable. IMPRESSION: No active disease. Electronically Signed   By: Jacqulynn Cadet M.D.   On: 02/04/2023 10:50    Pertinent labs & imaging results that were available during my care of the patient were reviewed by me and considered in my medical decision making (see MDM for details).  Medications Ordered in ED Medications  promethazine (PHENERGAN) 25 mg in sodium chloride 0.9 % 50 mL IVPB (0 mg Intravenous Stopped 02/04/23 1216)  sodium chloride 0.9 % bolus 1,000 mL (0 mLs Intravenous Stopped 02/04/23 1618)  magnesium sulfate IVPB 2 g 50 mL (0 g Intravenous Stopped 02/04/23 1234)  acetaminophen (TYLENOL) tablet 1,000 mg (1,000 mg Oral Given 02/04/23 1130)  diphenhydrAMINE (BENADRYL) injection 25 mg (25 mg Intravenous Given 02/04/23 1132)  ketorolac (TORADOL) 30 MG/ML injection 30 mg (30 mg Intravenous Given 02/04/23 1133)  alum & mag hydroxide-simeth (MAALOX/MYLANTA)  200-200-20 MG/5ML suspension 30 mL (30 mLs Oral Given 02/04/23 1138)    And  lidocaine (XYLOCAINE) 2 % viscous mouth solution 15 mL (15 mLs Oral Given 02/04/23 1139)  sodium chloride 0.9 % bolus 1,000 mL (0 mLs Intravenous Stopped 02/04/23 1738)  Procedures Procedures  (including critical care time)  Medical Decision Making / ED Course    Medical Decision Making:    Emilynn Beadie Matsunaga is a 33 y.o. female with past medical history as below, significant for asthma, OSA, recurrent headaches, prior tonsillectomy who presents to the ED with complaint of headache, body aches, abdominal pain, nausea, epigastric burning sensation. . The complaint involves an extensive differential diagnosis and also carries with it a high risk of complications and morbidity.  Serious etiology was considered. Ddx includes but is not limited to: Differential diagnosis includes but is not exclusive to subarachnoid hemorrhage, meningitis, encephalitis, previous head trauma, cavernous venous thrombosis, muscle tension headache, glaucoma, temporal arteritis, migraine or migraine equivalent, Differential diagnosis includes but is not exclusive to acute cholecystitis, intrathoracic causes for epigastric abdominal pain, gastritis, duodenitis, pancreatitis, small bowel or large bowel obstruction, abdominal aortic aneurysm, hernia, gastritis, etc. etc.   Complete initial physical exam performed, notably the patient  was no acute distress, neuro nonfocal.   HDS Reviewed and confirmed nursing documentation for past medical history, family history, social history.  Vital signs reviewed.   Additional history obtained: -Additional history obtained from na -External records from outside source obtained and reviewed including: Chart review including previous notes, labs, imaging, consultation notes  including primary care documentation, prior ED visits, prior labs and imaging, medications  Patient w/ recent COVID-19 infection, his only ongoing symptoms likely secondary to her COVID-19 infection.  Having headache today similar to prior migraines but symptoms unrelieved with one-time dose of Tylenol yesterday.  Exam today is reassuring.  Labs stable. chest X-ray and EKG unremarkable.  Chest pain symptoms are atypical, heart score is low.  ACS is unlikely.  She was given 2 rounds of migraine cocktail and this did alleviate her symptoms.  She is feeling much better.  Ambulatory with steady gait.  Headache has essentially resolved. Neuro exam is non-focal  Patient presents with headache. Based on the patient's history and physical there is very low clinical suspicion for significant intracranial pathology. The headache was not sudden onset, not maximal at onset, there are no neurologic findings on exam, the patient does not have a fever, the patient does not have any jaw claudication, the patient does not endorse a clotting disorder, patient denies any trauma or eye pain and the headache is not associated with dizziness, weakness on one side of the body, diplopia, vertigo, slurred speech, or ataxia. Given the extremely low risk of these diagnoses further testing and evaluation for these possibilities does not appear to be indicated at this time.   The patient improved significantly and was discharged in stable condition. Detailed discussions were had with the patient regarding current findings, and need for close f/u with PCP or on call doctor. The patient has been instructed to return immediately if the symptoms worsen in any way for re-evaluation. Patient verbalized understanding and is in agreement with current care plan. All questions answered prior to discharge.    Lab Tests: -I ordered, reviewed, and interpreted labs.   The pertinent results include:   Labs Reviewed  COMPREHENSIVE METABOLIC PANEL  - Abnormal; Notable for the following components:      Result Value   Calcium 8.8 (*)    All other components within normal limits  URINALYSIS, ROUTINE W REFLEX MICROSCOPIC - Abnormal; Notable for the following components:   APPearance HAZY (*)    All other components within normal limits  CBC WITH DIFFERENTIAL/PLATELET  LIPASE, BLOOD  PREGNANCY, URINE  TROPONIN I (HIGH SENSITIVITY)    Notable for stable as above  EKG   EKG Interpretation  Date/Time:  Monday February 04 2023 10:54:26 EST Ventricular Rate:  72 PR Interval:  166 QRS Duration: 98 QT Interval:  388 QTC Calculation: 425 R Axis:   4 Text Interpretation: Sinus rhythm Low voltage, precordial leads Nonspecific T abnormalities, diffuse leads twi noted in prior tracing to lead 3, aVF, V3 Confirmed by Wynona Dove ([id number]) on 02/04/2023 11:28:19 AM         Imaging Studies ordered: I ordered imaging studies including chest x-ray I independently visualized the following imaging with scope of interpretation limited to determining acute life threatening conditions related to emergency care: cxr, which revealed wnl I independently visualized and interpreted imaging. I agree with the radiologist interpretation   Medicines ordered and prescription drug management: Meds ordered this encounter  Medications   DISCONTD: ketorolac (TORADOL) 15 MG/ML injection 15 mg   promethazine (PHENERGAN) 25 mg in sodium chloride 0.9 % 50 mL IVPB   sodium chloride 0.9 % bolus 1,000 mL   magnesium sulfate IVPB 2 g 50 mL   acetaminophen (TYLENOL) tablet 1,000 mg   diphenhydrAMINE (BENADRYL) injection 25 mg   ketorolac (TORADOL) 30 MG/ML injection 30 mg   AND Linked Order Group    alum & mag hydroxide-simeth (MAALOX/MYLANTA) 200-200-20 MG/5ML suspension 30 mL    lidocaine (XYLOCAINE) 2 % viscous mouth solution 15 mL   sodium chloride 0.9 % bolus 1,000 mL   oxymetazoline (AFRIN NASAL SPRAY) 0.05 % nasal spray    Sig: Place 1 spray into both  nostrils 2 (two) times daily for 3 days.    Dispense:  15 mL    Refill:  0   promethazine (PHENERGAN) 12.5 MG tablet    Sig: Take 1 tablet (12.5 mg total) by mouth every 6 (six) hours as needed (headache).    Dispense:  10 tablet    Refill:  0   guaiFENesin-dextromethorphan (ROBITUSSIN DM) 100-10 MG/5ML syrup    Sig: Take 5 mLs by mouth every 4 (four) hours as needed for cough.    Dispense:  118 mL    Refill:  0   ondansetron (ZOFRAN) 4 MG tablet    Sig: Take 1 tablet (4 mg total) by mouth every 8 (eight) hours as needed for nausea or vomiting.    Dispense:  5 tablet    Refill:  0   sucralfate (CARAFATE) 1 g tablet    Sig: Take 1 tablet (1 g total) by mouth with breakfast, with lunch, and with evening meal for 7 days.    Dispense:  21 tablet    Refill:  0   albuterol (VENTOLIN HFA) 108 (90 Base) MCG/ACT inhaler    Sig: Inhale 1-2 puffs into the lungs every 6 (six) hours as needed for wheezing or shortness of breath.    Dispense:  1 each    Refill:  0    -I have reviewed the patients home medicines and have made adjustments as needed   Consultations Obtained: na   Cardiac Monitoring: The patient was maintained on a cardiac monitor.  I personally viewed and interpreted the cardiac monitored which showed an underlying rhythm of: sr  Social Determinants of Health:  Diagnosis or treatment significantly limited by social determinants of health: obesity   Reevaluation: After the interventions noted above, I reevaluated the patient and found that they have improved  Co morbidities that complicate the patient evaluation  Past Medical History:  Diagnosis Date   Asthma    Gout    Sleep apnea       Dispostion: Disposition decision including need for hospitalization was considered, and patient discharged from emergency department.    Final Clinical Impression(s) / ED Diagnoses Final diagnoses:  Atypical chest pain  Upper respiratory tract infection, unspecified type      This chart was dictated using voice recognition software.  Despite best efforts to proofread,  errors can occur which can change the documentation meaning.    Jeanell Sparrow, DO 02/05/23 [phone]

## 2023-02-04 NOTE — Discharge Instructions (Addendum)
It was a pleasure caring for you today in the emergency department.  Please return to the emergency department for any worsening or worrisome symptoms.  

## 2023-02-04 NOTE — ED Triage Notes (Signed)
Pt states that she is recovering from [place] approximately one week ago and she is now having CP described as burning, continued cough, malaise, N/V

## 2023-03-15 ENCOUNTER — Ambulatory Visit (INDEPENDENT_AMBULATORY_CARE_PROVIDER_SITE_OTHER): Payer: 59 | Admitting: Family Medicine

## 2023-03-15 ENCOUNTER — Telehealth: Payer: Self-pay | Admitting: Family Medicine

## 2023-03-15 ENCOUNTER — Encounter: Payer: Self-pay | Admitting: Family Medicine

## 2023-03-15 VITALS — BP 118/74 | HR 78 | Temp 97.8°F | Ht 63.0 in | Wt 227.0 lb

## 2023-03-15 DIAGNOSIS — G8929 Other chronic pain: Secondary | ICD-10-CM | POA: Diagnosis not present

## 2023-03-15 DIAGNOSIS — G43709 Chronic migraine without aura, not intractable, without status migrainosus: Secondary | ICD-10-CM | POA: Insufficient documentation

## 2023-03-15 DIAGNOSIS — Z7689 Persons encountering health services in other specified circumstances: Secondary | ICD-10-CM | POA: Diagnosis not present

## 2023-03-15 DIAGNOSIS — J4540 Moderate persistent asthma, uncomplicated: Secondary | ICD-10-CM | POA: Insufficient documentation

## 2023-03-15 DIAGNOSIS — K219 Gastro-esophageal reflux disease without esophagitis: Secondary | ICD-10-CM | POA: Insufficient documentation

## 2023-03-15 DIAGNOSIS — F319 Bipolar disorder, unspecified: Secondary | ICD-10-CM | POA: Insufficient documentation

## 2023-03-15 DIAGNOSIS — G4733 Obstructive sleep apnea (adult) (pediatric): Secondary | ICD-10-CM | POA: Diagnosis not present

## 2023-03-15 DIAGNOSIS — J3089 Other allergic rhinitis: Secondary | ICD-10-CM | POA: Diagnosis not present

## 2023-03-15 DIAGNOSIS — F32A Depression, unspecified: Secondary | ICD-10-CM | POA: Insufficient documentation

## 2023-03-15 MED ORDER — PANTOPRAZOLE SODIUM 40 MG PO TBEC: 40.0000 mg | DELAYED_RELEASE_TABLET | Freq: Every day | ORAL | 3 refills | Status: DC

## 2023-03-15 MED ORDER — SUMATRIPTAN SUCCINATE 25 MG PO TABS: 25.0000 mg | ORAL_TABLET | ORAL | 0 refills | Status: DC | PRN

## 2023-03-15 MED ORDER — ALBUTEROL SULFATE HFA 108 (90 BASE) MCG/ACT IN AERS: 1.0000 | INHALATION_SPRAY | RESPIRATORY_TRACT | 1 refills | Status: AC | PRN

## 2023-03-15 MED ORDER — BUDESONIDE-FORMOTEROL FUMARATE 160-4.5 MCG/ACT IN AERO: 2.0000 | INHALATION_SPRAY | Freq: Two times a day (BID) | RESPIRATORY_TRACT | 3 refills | Status: AC

## 2023-03-15 MED ORDER — MONTELUKAST SODIUM 10 MG PO TABS: 10.0000 mg | ORAL_TABLET | Freq: Every day | ORAL | 3 refills | Status: DC

## 2023-03-15 MED ORDER — TOPIRAMATE 25 MG PO TABS: 25.0000 mg | ORAL_TABLET | Freq: Every day | ORAL | 1 refills | Status: DC

## 2023-03-15 MED ORDER — ALBUTEROL SULFATE HFA 108 (90 BASE) MCG/ACT IN AERS: 1.0000 | INHALATION_SPRAY | RESPIRATORY_TRACT | 1 refills | Status: DC | PRN

## 2023-03-15 MED ORDER — ALBUTEROL SULFATE (2.5 MG/3ML) 0.083% IN NEBU: INHALATION_SOLUTION | RESPIRATORY_TRACT | 0 refills | Status: DC

## 2023-03-15 MED ORDER — MONTELUKAST SODIUM 10 MG PO TABS: 10.0000 mg | ORAL_TABLET | Freq: Every day | ORAL | 3 refills | Status: AC

## 2023-03-15 MED ORDER — BUDESONIDE-FORMOTEROL FUMARATE 160-4.5 MCG/ACT IN AERO: 2.0000 | INHALATION_SPRAY | Freq: Two times a day (BID) | RESPIRATORY_TRACT | 3 refills | Status: DC

## 2023-03-15 MED ORDER — ALBUTEROL SULFATE (2.5 MG/3ML) 0.083% IN NEBU: INHALATION_SOLUTION | RESPIRATORY_TRACT | 0 refills | Status: AC

## 2023-03-15 MED ORDER — PANTOPRAZOLE SODIUM 40 MG PO TBEC: 40.0000 mg | DELAYED_RELEASE_TABLET | Freq: Every day | ORAL | 3 refills | Status: AC

## 2023-03-15 NOTE — Assessment & Plan Note (Signed)
Restart Topamax 25 mg daily.  Sumatriptan prescribed.  Follow-up in 4 weeks.

## 2023-03-15 NOTE — Patient Instructions (Signed)
Restart allergy and asthma medications.   Start Protonix for acid reflux and abdominal pain.   Start Topamax daily for migraine prevention.   Take the sumatriptan 25 mg at onset of headache and you may repeat 2 hours later if headache is not resolved.   I ordered a home sleep test and the sleep center will call you to schedule this.   You will get a call to schedule a MRI of your brain.

## 2023-03-15 NOTE — Telephone Encounter (Signed)
Patient had appointment today 03/15/23, but forgot to change her pharmacy. The below list of medications [facility] to [facility] [address] in [place] instead.  albuterol (VENTOLIN HFA) 108 (90 Base) MCG/ACT inhaler  albuterol (PROVENTIL) (2.5 MG/3ML) 0.083% nebulizer solution budesonide-formoterol (SYMBICORT) 160-4.5 MCG/ACT inhaler montelukast (SINGULAIR) 10 MG tablet  pantoprazole (PROTONIX) 40 MG tablet  SUMAtriptan (IMITREX) 25 MG tablet  topiramate (TOPAMAX) 25 MG tablet

## 2023-03-15 NOTE — Telephone Encounter (Signed)
[facility] 

## 2023-03-15 NOTE — Assessment & Plan Note (Signed)
Restart Singulair.

## 2023-03-15 NOTE — Assessment & Plan Note (Signed)
She is not using CPAP in several years.  Moved here from [place].  She will need a new home sleep test in order to get a new CPAP machine.  HST ordered.

## 2023-03-15 NOTE — Assessment & Plan Note (Signed)
She does not appear to be in any danger.  She is not self-medicating.  Previously on medication but she is not sure what she was taking when she lived in [place].  No medication in the past 3 years.  She is not in therapy.  She will follow-up in 4 weeks and we will consider starting her on medication for her mood.  Recommend counseling.

## 2023-03-15 NOTE — Assessment & Plan Note (Signed)
Headaches are more frequent than usual and more debilitating.  MRI brain ordered.  Restart Topamax Follow-up in 4 weeks.  Reports being under the care of a neurologist when living in [place].  Consider referral to neurology as needed.

## 2023-03-15 NOTE — Progress Notes (Signed)
New Patient Office Visit  Subjective    Patient ID: Carly Perez, female    DOB: [birth year]  Age: 32 y.o. MRN: [id number]  CC:  Chief Complaint  Patient presents with   Establish Care    Gets bad migraines daily, also would like to discuss loss of feeling in her right hand.  Abdominal pain in stomach    HPI Carly Perez presents to establish care Moved here from [place] 3 years ago.   No recent PCP.  She typically goes to the emergency department.  She has problem list and previous medications that she was taking prior to moving here on her phone.  States she has been having headaches daily. They last 10 minutes approximately.  Pain is typically in the back of her head and then moves to the top.  Denies pulsating.   States she has multiple headaches throughout the day.  This is new.  She does have a history of migraine headaches.  No aura.  Reports associated nausea and photosensitivity. occasionally wakes up with headaches.  Prior to moving here she was taking Topamax 25 mg daily.  She also had Imitrex to take as needed for migraines.  She was also taking diclofenac. Denies history of head trauma.  No known history of brain MRI or CT.  Ibuprofen and Tylenol does not help.    OSA- used a CPAP several years ago.  States she has not had a CPAP in years.  Asthma-reports being out of her nebulizer solution and albuterol inhaler.  She has also been out of Symbicort  Allergies-she used to take Singulair and requests refill.  Reports history of depression and bipolar disorder.  She has not been on medication in years.  Denies SI.  GERD-having symptoms of acid reflux.  Is not taking any medication.  Denies history of ulcers.  Denies fever, chills, dizziness, chest pain, palpitations, shortness of breath, vomiting, diarrhea, urinary symptoms.   States she has a female partner.  Works as a Quarry manager  Denies drug use.  Outpatient Encounter Medications  as of 03/15/2023  Medication Sig   [DISCONTINUED] budesonide-formoterol (SYMBICORT) 160-4.5 MCG/ACT inhaler Inhale 2 puffs into the lungs 2 (two) times daily.   [DISCONTINUED] montelukast (SINGULAIR) 10 MG tablet Take 1 tablet (10 mg total) by mouth at bedtime.   [DISCONTINUED] pantoprazole (PROTONIX) 40 MG tablet Take 1 tablet (40 mg total) by mouth daily.   [DISCONTINUED] SUMAtriptan (IMITREX) 25 MG tablet Take 1 tablet (25 mg total) by mouth every 2 (two) hours as needed for migraine. May repeat in 2 hours if headache persists or recurs.   [DISCONTINUED] topiramate (TOPAMAX) 25 MG tablet Take 1 tablet (25 mg total) by mouth daily.   albuterol (PROVENTIL) (2.5 MG/3ML) 0.083% nebulizer solution albuterol sulfate 2.5 mg/3 mL (0.083 %) solution for nebulization   albuterol (VENTOLIN HFA) 108 (90 Base) MCG/ACT inhaler Inhale 1-2 puffs into the lungs every 4 (four) hours as needed.   budesonide-formoterol (SYMBICORT) 160-4.5 MCG/ACT inhaler Inhale 2 puffs into the lungs 2 (two) times daily.   escitalopram (LEXAPRO) 20 MG tablet escitalopram 20 mg tablet (Patient not taking: Reported on 03/15/2023)   montelukast (SINGULAIR) 10 MG tablet Take 1 tablet (10 mg total) by mouth at bedtime.   ondansetron (ZOFRAN) 4 MG tablet Take 1 tablet (4 mg total) by mouth every 8 (eight) hours as needed for nausea or vomiting. (Patient not taking: Reported on 03/15/2023)   pantoprazole (PROTONIX) 40 MG tablet Take 1 tablet (40 mg  total) by mouth daily.   promethazine (PHENERGAN) 12.5 MG tablet Take 1 tablet (12.5 mg total) by mouth every 6 (six) hours as needed (headache). (Patient not taking: Reported on 03/15/2023)   sucralfate (CARAFATE) 1 g tablet Take 1 tablet (1 g total) by mouth with breakfast, with lunch, and with evening meal for 7 days. (Patient not taking: Reported on 03/15/2023)   SUMAtriptan (IMITREX) 25 MG tablet Take 1 tablet (25 mg total) by mouth every 2 (two) hours as needed for migraine. May repeat in 2  hours if headache persists or recurs.   topiramate (TOPAMAX) 25 MG tablet Take 1 tablet (25 mg total) by mouth daily.   [DISCONTINUED] albuterol (PROVENTIL) (2.5 MG/3ML) 0.083% nebulizer solution albuterol sulfate 2.5 mg/3 mL (0.083 %) solution for nebulization (Patient not taking: Reported on 03/15/2023)   [DISCONTINUED] albuterol (PROVENTIL) (2.5 MG/3ML) 0.083% nebulizer solution albuterol sulfate 2.5 mg/3 mL (0.083 %) solution for nebulization   [DISCONTINUED] albuterol (VENTOLIN HFA) 108 (90 Base) MCG/ACT inhaler Inhale 1-2 puffs into the lungs every 4 (four) hours as needed. (Patient not taking: Reported on 03/15/2023)   [DISCONTINUED] albuterol (VENTOLIN HFA) 108 (90 Base) MCG/ACT inhaler Inhale 1-2 puffs into the lungs every 6 (six) hours as needed for wheezing or shortness of breath. (Patient not taking: Reported on 03/15/2023)   [DISCONTINUED] albuterol (VENTOLIN HFA) 108 (90 Base) MCG/ACT inhaler Inhale 1-2 puffs into the lungs every 4 (four) hours as needed.   [DISCONTINUED] guaiFENesin-dextromethorphan (ROBITUSSIN DM) 100-10 MG/5ML syrup Take 5 mLs by mouth every 4 (four) hours as needed for cough.   [DISCONTINUED] ondansetron (ZOFRAN-ODT) 8 MG disintegrating tablet Take 1 tablet (8 mg total) by mouth every 8 (eight) hours as needed. (Patient not taking: Reported on 03/15/2023)   No facility-administered encounter medications on file as of 03/15/2023.    Past Medical History:  Diagnosis Date   Asthma    Depression 03/15/2023   Environmental and seasonal allergies 03/15/2023   Gastroesophageal reflux disease 03/15/2023   Gout    Sleep apnea     Past Surgical History:  Procedure Laterality Date   CYST EXCISION     TONSILLECTOMY      Family History  Problem Relation Age of Onset   ADD / ADHD Mother    Asthma Father    Depression Father     Social History   Socioeconomic History   Marital status: Single    Spouse name: Not on file   Number of children: Not on file   Years of  education: Not on file   Highest education level: Not on file  Occupational History   Not on file  Tobacco Use   Smoking status: Never   Smokeless tobacco: Never  Vaping Use   Vaping Use: Never used  Substance and Sexual Activity   Alcohol use: Never   Drug use: Never   Sexual activity: Never  Other Topics Concern   Not on file  Social History Narrative   Not on file   Social Determinants of Health   Financial Resource Strain: Not on file  Food Insecurity: Not on file  Transportation Needs: Not on file  Physical Activity: Not on file  Stress: Not on file  Social Connections: Not on file  Intimate Partner Violence: Not on file    ROS      Objective    BP 118/74 (BP Location: Left Arm, Patient Position: Sitting, Cuff Size: Large)   Pulse 78   Temp 97.8 F (36.6 C) (Temporal)  Ht 5\' 3"  (1.6 m)   Wt 227 lb (103 kg)   SpO2 99%   BMI 40.21 kg/m   Physical Exam Constitutional:      General: She is not in acute distress.    Appearance: She is not ill-appearing.  HENT:     Right Ear: Tympanic membrane and ear canal normal.     Left Ear: Tympanic membrane and ear canal normal.     Mouth/Throat:     Mouth: Mucous membranes are moist.  Eyes:     Conjunctiva/sclera: Conjunctivae normal.  Cardiovascular:     Rate and Rhythm: Normal rate and regular rhythm.  Pulmonary:     Effort: Pulmonary effort is normal.     Breath sounds: Normal breath sounds.  Musculoskeletal:     Cervical back: Normal range of motion and neck supple.  Skin:    General: Skin is warm and dry.  Neurological:     General: No focal deficit present.     Mental Status: She is alert and oriented to person, place, and time.     Cranial Nerves: No cranial nerve deficit.     Sensory: No sensory deficit.     Motor: No weakness.     Coordination: Coordination normal.  Psychiatric:        Mood and Affect: Mood normal.        Behavior: Behavior normal.        Thought Content: Thought content  normal.         Assessment & Plan:   Problem List Items Addressed This Visit       Cardiovascular and Mediastinum   Chronic migraine without aura without status migrainosus, not intractable    Restart Topamax 25 mg daily.  Sumatriptan prescribed.  Follow-up in 4 weeks.      Relevant Medications   SUMAtriptan (IMITREX) 25 MG tablet   topiramate (TOPAMAX) 25 MG tablet   Other Relevant Orders   MR BRAIN WO CONTRAST     Respiratory   Moderate persistent asthma without complication    Treat allergies with Singulair.  Refilled Symbicort, she has been out of this for over a year.  Refilled albuterol inhaler and nebulizer.  Follow-up in 4 weeks.      Relevant Medications   albuterol (VENTOLIN HFA) 108 (90 Base) MCG/ACT inhaler   albuterol (PROVENTIL) (2.5 MG/3ML) 0.083% nebulizer solution   budesonide-formoterol (SYMBICORT) 160-4.5 MCG/ACT inhaler   montelukast (SINGULAIR) 10 MG tablet   OSA (obstructive sleep apnea)    She is not using CPAP in several years.  Moved here from [place].  She will need a new home sleep test in order to get a new CPAP machine.  HST ordered.      Relevant Orders   Home sleep test     Digestive   Gastroesophageal reflux disease   Relevant Medications   pantoprazole (PROTONIX) 40 MG tablet     Other   Bipolar disorder (HCC)   Chronic headache with new features - Primary    Headaches are more frequent than usual and more debilitating.  MRI brain ordered.  Restart Topamax Follow-up in 4 weeks.  Reports being under the care of a neurologist when living in [place].  Consider referral to neurology as needed.      Relevant Medications   SUMAtriptan (IMITREX) 25 MG tablet   topiramate (TOPAMAX) 25 MG tablet   Other Relevant Orders   MR BRAIN WO CONTRAST   Depression    She does not appear  to be in any danger.  She is not self-medicating.  Previously on medication but she is not sure what she was taking when she lived in [place].  No  medication in the past 3 years.  She is not in therapy.  She will follow-up in 4 weeks and we will consider starting her on medication for her mood.  Recommend counseling.      Environmental and seasonal allergies    Restart Singulair.      Relevant Medications   montelukast (SINGULAIR) 10 MG tablet   Other Visit Diagnoses     Encounter to establish care           Return in about 4 weeks (around 04/12/2023) for chronic health conditions.   Harland Dingwall, NP-C

## 2023-03-15 NOTE — Assessment & Plan Note (Signed)
Treat allergies with Singulair.  Refilled Symbicort, she has been out of this for over a year.  Refilled albuterol inhaler and nebulizer.  Follow-up in 4 weeks.

## 2023-04-05 ENCOUNTER — Ambulatory Visit
Admission: RE | Admit: 2023-04-05 | Discharge: 2023-04-05 | Disposition: A | Discharge status: Home / Self Care | Payer: 59 | Source: Ambulatory Visit | Attending: Family Medicine | Admitting: Family Medicine

## 2023-04-05 DIAGNOSIS — G43709 Chronic migraine without aura, not intractable, without status migrainosus: Secondary | ICD-10-CM

## 2023-04-05 DIAGNOSIS — G8929 Other chronic pain: Secondary | ICD-10-CM

## 2023-04-05 DIAGNOSIS — R519 Headache, unspecified: Secondary | ICD-10-CM | POA: Diagnosis not present

## 2023-04-08 ENCOUNTER — Ambulatory Visit (HOSPITAL_BASED_OUTPATIENT_CLINIC_OR_DEPARTMENT_OTHER): Payer: 59 | Attending: Family Medicine | Admitting: Internal Medicine

## 2023-04-08 DIAGNOSIS — R0683 Snoring: Secondary | ICD-10-CM | POA: Insufficient documentation

## 2023-04-08 DIAGNOSIS — G4733 Obstructive sleep apnea (adult) (pediatric): Secondary | ICD-10-CM

## 2023-04-09 NOTE — Progress Notes (Signed)
Her brain MRI is negative for anything concerning in her brain. She does have a cervical disc at C4-C5 which is protruding causing moderate spinal canal stenosis. This may be causing her arm numbness which we did not really get to discuss at her last visit. We will need to discuss this at her follow up.

## 2023-04-11 ENCOUNTER — Other Ambulatory Visit: Payer: Self-pay | Admitting: Family Medicine

## 2023-04-11 DIAGNOSIS — G43709 Chronic migraine without aura, not intractable, without status migrainosus: Secondary | ICD-10-CM

## 2023-04-12 ENCOUNTER — Encounter: Payer: Self-pay | Admitting: Family Medicine

## 2023-04-12 ENCOUNTER — Ambulatory Visit (INDEPENDENT_AMBULATORY_CARE_PROVIDER_SITE_OTHER): Payer: 59 | Admitting: Family Medicine

## 2023-04-12 VITALS — BP 114/70 | HR 70 | Temp 97.8°F | Ht 63.0 in | Wt 220.0 lb

## 2023-04-12 DIAGNOSIS — R200 Anesthesia of skin: Secondary | ICD-10-CM | POA: Diagnosis not present

## 2023-04-12 DIAGNOSIS — G4733 Obstructive sleep apnea (adult) (pediatric): Secondary | ICD-10-CM

## 2023-04-12 DIAGNOSIS — Z8669 Personal history of other diseases of the nervous system and sense organs: Secondary | ICD-10-CM | POA: Diagnosis not present

## 2023-04-12 DIAGNOSIS — R519 Headache, unspecified: Secondary | ICD-10-CM | POA: Diagnosis not present

## 2023-04-12 DIAGNOSIS — M50121 Cervical disc disorder at C4-C5 level with radiculopathy: Secondary | ICD-10-CM | POA: Diagnosis not present

## 2023-04-12 DIAGNOSIS — F314 Bipolar disorder, current episode depressed, severe, without psychotic features: Secondary | ICD-10-CM | POA: Diagnosis not present

## 2023-04-12 DIAGNOSIS — G8929 Other chronic pain: Secondary | ICD-10-CM

## 2023-04-12 DIAGNOSIS — J4540 Moderate persistent asthma, uncomplicated: Secondary | ICD-10-CM | POA: Diagnosis not present

## 2023-04-12 MED ORDER — PREDNISONE 20 MG PO TABS: 40.0000 mg | ORAL_TABLET | Freq: Every day | ORAL | 0 refills | Status: DC

## 2023-04-12 MED ORDER — ESCITALOPRAM OXALATE 10 MG PO TABS: 10.0000 mg | ORAL_TABLET | Freq: Every day | ORAL | 2 refills | Status: DC

## 2023-04-12 MED ORDER — TOPIRAMATE 50 MG PO TABS: 50.0000 mg | ORAL_TABLET | Freq: Two times a day (BID) | ORAL | 2 refills | Status: AC

## 2023-04-12 NOTE — Progress Notes (Signed)
Subjective:     Patient ID: Carly Perez, female    DOB: 01-10-1991, 32 y.o.   MRN: [id number]  Chief Complaint  Patient presents with   Medical Management of Chronic Issues    4 week f/u    HPI Patient is in today for follow up on several conditions. She also has a new concern of depression.   C/o depression as far back as childhood. She used to be on Lexapro but out of it for the past 2 years. States she did well on it.  No SI.  She used to cut herself. Not in years. Listens to music now.  Previously in therapy and under the care of a psychiatrist in [place].   Migraines - started back on Topamax 25 mg bid.  Having daily headaches. Waking up with headaches. Headaches are debilitating and constant. Interferes with appetite and ability to function.  Used Imitrex, all 10 doses since I last saw her.  MRI negative  She was under the care of a neurologist in [place].   OSA- ordered HST, she did the test this week.   Asthma- breathing better. No longer needing nebulizer or albuterol as often. Using Symbicort   GERD- taking Protonix prn   Right arm numbness and occasional weakness to the point she drops her cell phone.  MRI brain showed disc protrusion at C4-C5 Denies neck pain.  I am increasing your     Health Maintenance Due  Topic Date Due   HIV Screening  Never done   Hepatitis C Screening  Never done   DTaP/Tdap/Td (1 - Tdap) Never done   PAP SMEAR-Modifier  Never done    Past Medical History:  Diagnosis Date   Asthma    Depression 03/15/2023   Environmental and seasonal allergies 03/15/2023   Gastroesophageal reflux disease 03/15/2023   Gout    Sleep apnea     Past Surgical History:  Procedure Laterality Date   CYST EXCISION     TONSILLECTOMY      Family History  Problem Relation Age of Onset   ADD / ADHD Mother    Asthma Father    Depression Father     Social History   Socioeconomic History   Marital status: Single     Spouse name: Not on file   Number of children: Not on file   Years of education: Not on file   Highest education level: 12th grade  Occupational History   Not on file  Tobacco Use   Smoking status: Never   Smokeless tobacco: Never  Vaping Use   Vaping Use: Never used  Substance and Sexual Activity   Alcohol use: Never   Drug use: Never   Sexual activity: Never  Other Topics Concern   Not on file  Social History Narrative   Not on file   Social Determinants of Health   Financial Resource Strain: Low Risk  (04/08/2023)   Overall Financial Resource Strain (CARDIA)    Difficulty of Paying Living Expenses: Not hard at all  Food Insecurity: No Food Insecurity (04/08/2023)   Hunger Vital Sign    Worried About Running Out of Food in the Last Year: Never true    Ran Out of Food in the Last Year: Never true  Transportation Needs: No Transportation Needs (04/08/2023)   PRAPARE - Administrator, Civil Service (Medical): No    Lack of Transportation (Non-Medical): No  Physical Activity: Unknown (04/08/2023)   Exercise Vital Sign  Days of Exercise per Week: 0 days    Minutes of Exercise per Session: Not on file  Stress: Stress Concern Present (04/08/2023)   [employer] of Occupational Health - Occupational Stress Questionnaire    Feeling of Stress : Very much  Social Connections: Socially Isolated (04/08/2023)   Social Connection and Isolation Panel [NHANES]    Frequency of Communication with Friends and Family: More than three times a week    Frequency of Social Gatherings with Friends and Family: Once a week    Attends Religious Services: Never    Database administrator or Organizations: No    Attends Engineer, structural: Not on file    Marital Status: Never married  Intimate Partner Violence: Not on file    Outpatient Medications Prior to Visit  Medication Sig Dispense Refill   albuterol (PROVENTIL) (2.5 MG/3ML) 0.083% nebulizer solution albuterol sulfate  2.5 mg/3 mL (0.083 %) solution for nebulization 75 mL 0   albuterol (VENTOLIN HFA) 108 (90 Base) MCG/ACT inhaler Inhale 1-2 puffs into the lungs every 4 (four) hours as needed. 8 g 1   budesonide-formoterol (SYMBICORT) 160-4.5 MCG/ACT inhaler Inhale 2 puffs into the lungs 2 (two) times daily. 1 each 3   montelukast (SINGULAIR) 10 MG tablet Take 1 tablet (10 mg total) by mouth at bedtime. 30 tablet 3   ondansetron (ZOFRAN) 4 MG tablet Take 1 tablet (4 mg total) by mouth every 8 (eight) hours as needed for nausea or vomiting. 5 tablet 0   pantoprazole (PROTONIX) 40 MG tablet Take 1 tablet (40 mg total) by mouth daily. 30 tablet 3   promethazine (PHENERGAN) 12.5 MG tablet Take 1 tablet (12.5 mg total) by mouth every 6 (six) hours as needed (headache). 10 tablet 0   SUMAtriptan (IMITREX) 25 MG tablet TAKE 1 TABLET BY MOUTH EVERY 2 HOURS AS NEEDED FOR MIGRAINE. MAY REPEAT IN 2 HOURS IF HEADACHE PERSISTS OR RECURS. 10 tablet 0   topiramate (TOPAMAX) 25 MG tablet Take 1 tablet (25 mg total) by mouth daily. 30 tablet 1   sucralfate (CARAFATE) 1 g tablet Take 1 tablet (1 g total) by mouth with breakfast, with lunch, and with evening meal for 7 days. (Patient not taking: Reported on 03/15/2023) 21 tablet 0   escitalopram (LEXAPRO) 20 MG tablet escitalopram 20 mg tablet (Patient not taking: Reported on 04/12/2023)     No facility-administered medications prior to visit.    Allergies  Allergen Reactions   Cat Hair Extract Hives   Dog Epithelium (Canis Lupus Familiaris) Hives   Grass Pollen(K-O-R-T-Swt Vern) Other (See Comments) and Hives   Other Hives and Other (See Comments)    Dust mites   Shellfish-Derived Products Hives    Review of Systems  Constitutional:  Positive for malaise/fatigue and weight loss. Negative for chills and fever.  HENT:  Positive for congestion.   Eyes:  Negative for blurred vision, double vision and photophobia.  Respiratory:  Negative for cough, shortness of breath and  wheezing.   Cardiovascular:  Negative for chest pain, palpitations and leg swelling.  Gastrointestinal:  Positive for nausea. Negative for abdominal pain, constipation, diarrhea and vomiting.  Genitourinary:  Negative for dysuria, frequency and urgency.  Musculoskeletal:  Negative for back pain and neck pain.  Neurological:  Positive for tingling, sensory change and headaches. Negative for dizziness and focal weakness.       Numbness/tingling/subjective weakness of right arm  Psychiatric/Behavioral:  Positive for depression. Negative for hallucinations, substance abuse and suicidal  ideas.        Objective:    Physical Exam Constitutional:      General: She is not in acute distress.    Appearance: She is not ill-appearing.  HENT:     Nose: Congestion present.     Mouth/Throat:     Mouth: Mucous membranes are moist.  Eyes:     Extraocular Movements: Extraocular movements intact.     Conjunctiva/sclera: Conjunctivae normal.  Cardiovascular:     Rate and Rhythm: Normal rate and regular rhythm.  Pulmonary:     Effort: Pulmonary effort is normal.     Breath sounds: Normal breath sounds.  Musculoskeletal:        General: Normal range of motion.     Cervical back: Normal range of motion and neck supple.  Skin:    General: Skin is warm and dry.  Neurological:     General: No focal deficit present.     Mental Status: She is alert and oriented to person, place, and time.     Cranial Nerves: No cranial nerve deficit or facial asymmetry.     Motor: Motor function is intact. No weakness or pronator drift.     Coordination: Coordination is intact. Finger-Nose-Finger Test normal.     Gait: Gait is intact.  Psychiatric:        Attention and Perception: Attention normal.        Mood and Affect: Mood is depressed.        Speech: Speech normal.        Behavior: Behavior normal.        Thought Content: Thought content normal. Thought content is not paranoid. Thought content does not include  homicidal or suicidal ideation.     BP 114/70 (BP Location: Left Arm, Patient Position: Sitting, Cuff Size: Large)   Pulse 70   Temp 97.8 F (36.6 C) (Temporal)   Ht 5\' 3"  (1.6 m)   Wt 220 lb (99.8 kg)   SpO2 99%   BMI 38.97 kg/m  Wt Readings from Last 3 Encounters:  04/12/23 220 lb (99.8 kg)  04/08/23 227 lb (103 kg)  03/15/23 227 lb (103 kg)       Assessment & Plan:   Problem List Items Addressed This Visit       Respiratory   Moderate persistent asthma without complication    No recent flares.  Asthma improving with Symbicort.  Using albuterol and nebulizer as needed.  Continue Singulair for allergies      Relevant Medications   predniSONE (DELTASONE) 20 MG tablet   OSA (obstructive sleep apnea)    Home sleep test done earlier this week per patient.  Awaiting results.  Discussed untreated sleep apnea may be contributing to headaches.  Follow-up once I have her results.        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  Course of steroids prescribed.  No weakness on exam.  Referral to orthopedist for further evaluation and treatment.      Relevant Medications   predniSONE (DELTASONE) 20 MG tablet   Other Relevant Orders   Ambulatory referral to Orthopedic Surgery     Other   Bipolar disorder    Restarted Lexapro.  She has been out of the medication for approximately 2 years.  Referral to psychiatry.  She was under the care of a psychiatrist and counselor in [place] prior to moving here.  History of cutting but no longer an issue. No SI or HI.  Mood is appropriate.  Not self-medicating.      Relevant Medications   escitalopram (LEXAPRO) 10 MG tablet   Other Relevant Orders   Ambulatory referral to Psychiatry   Chronic headache with new features    Headaches are more frequent than usual and more debilitating.  MRI brain negative.  Restarted Topamax 25 mg twice daily 4 weeks  ago.  Increase Topamax to 50 mg twice daily today.  Reports being under the care of a neurologist when living in [place].  Referral to neurologist today.      Relevant Medications   predniSONE (DELTASONE) 20 MG tablet   topiramate (TOPAMAX) 50 MG tablet   escitalopram (LEXAPRO) 10 MG tablet   Other Relevant Orders   Ambulatory referral to Neurology   Right arm numbness    Subjective.  Most likely due to cervical disc protrusion with moderate spinal canal stenosis at C4-C5.  Referral to orthopedist.      Relevant Orders   Ambulatory referral to Orthopedic Surgery   Other Visit Diagnoses     History of migraine       Relevant Medications   topiramate (TOPAMAX) 50 MG tablet   Other Relevant Orders   Ambulatory referral to Neurology       I have discontinued Carly Perez's escitalopram and topiramate. I am also having her start on predniSONE, topiramate, and escitalopram. Additionally, I am having her maintain her promethazine, ondansetron, sucralfate, albuterol, albuterol, budesonide-formoterol, montelukast, pantoprazole, and SUMAtriptan.  Meds ordered this encounter  Medications   predniSONE (DELTASONE) 20 MG tablet    Sig: Take 2 tablets (40 mg total) by mouth daily with breakfast.    Dispense:  10 tablet    Refill:  0    Order Specific Question:   Supervising Provider    Answer:   Hillard Danker A [[id number]]   topiramate (TOPAMAX) 50 MG tablet    Sig: Take 1 tablet (50 mg total) by mouth 2 (two) times daily.    Dispense:  30 tablet    Refill:  2    Order Specific Question:   Supervising Provider    Answer:   Hillard Danker A [[id number]]   escitalopram (LEXAPRO) 10 MG tablet    Sig: Take 1 tablet (10 mg total) by mouth daily.    Dispense:  30 tablet    Refill:  2    Order Specific Question:   Supervising Provider    Answer:   Hillard Danker A [[id number]]

## 2023-04-12 NOTE — Assessment & Plan Note (Signed)
Restarted Lexapro.  She has been out of the medication for approximately 2 years.  Referral to psychiatry.  She was under the care of a psychiatrist and counselor in [place] prior to moving here.  History of cutting but no longer an issue. No SI or HI.  Mood is appropriate.  Not self-medicating.

## 2023-04-12 NOTE — Assessment & Plan Note (Signed)
No recent flares.  Asthma improving with Symbicort.  Using albuterol and nebulizer as needed.  Continue Singulair for allergies

## 2023-04-12 NOTE — Assessment & Plan Note (Signed)
Headaches are more frequent than usual and more debilitating.  MRI brain negative.  Restarted Topamax 25 mg twice daily 4 weeks ago.  Increase Topamax to 50 mg twice daily today.  Reports being under the care of a neurologist when living in [place].  Referral to neurologist today.

## 2023-04-12 NOTE — Assessment & Plan Note (Signed)
Seen on MRI of brain.  Course of steroids prescribed.  No weakness on exam.  Referral to orthopedist for further evaluation and treatment.

## 2023-04-12 NOTE — Patient Instructions (Addendum)
Topamax dose to 50 mg twice daily.  Take the steroids by mouth daily for the next 5 days with food and a full glass of water. This will hopefully help with your headache and right arm numbness  I have placed a referral to [facility] neurology and they will call you to schedule a visit.  I also referred you to an orthopedist and they will call you to schedule a visit regarding your cervical disc issue and right arm numbness/weakness  Start the Lexapro 10 mg daily.  I referred you to psychiatry and they will call you to schedule an appointment. Let me know if it has been more than 2 weeks and you have not heard from anyone.  Continue your allergy and asthma medications.  Follow-up here in 2 months or sooner if needed.

## 2023-04-12 NOTE — Assessment & Plan Note (Signed)
Subjective.  Most likely due to cervical disc protrusion with moderate spinal canal stenosis at C4-C5.  Referral to orthopedist.

## 2023-04-12 NOTE — Assessment & Plan Note (Signed)
Home sleep test done earlier this week per patient.  Awaiting results.  Discussed untreated sleep apnea may be contributing to headaches.  Follow-up once I have her results.

## 2023-04-13 DIAGNOSIS — G4733 Obstructive sleep apnea (adult) (pediatric): Secondary | ICD-10-CM

## 2023-04-13 NOTE — Procedures (Signed)
   Patient Name: Carly Perez, Carly Perez Date: 04/08/2023 Gender: Female D.O.B: 05/30/1991 Age (years): 31 Referring Provider: Avanell Shackleton NP Height (inches): 63 Interpreting Physician: Jetty Duhamel MD, ABSM Weight (lbs): 227 RPSGT: Ackerman Sink BMI: 40 MRN: [id number] Neck Size: 15.00  CLINICAL INFORMATION Sleep Study Type: HST Indication for sleep study: Snoring Epworth Sleepiness Score: 21  SLEEP STUDY TECHNIQUE A multi-channel overnight portable sleep study was performed. The channels recorded were: nasal airflow, thoracic respiratory movement, and oxygen saturation with a pulse oximetry. Snoring was also monitored.  MEDICATIONS Patient self administered medications include: none reported.  SLEEP ARCHITECTURE Patient was studied for 528.1 minutes. The sleep efficiency was 100.0 % and the patient was supine for 0%. The arousal index was 0.0 per hour.  RESPIRATORY PARAMETERS The overall AHI was 4.8 per hour, with a central apnea index of 0 per hour. The oxygen nadir was 76% during sleep.  CARDIAC DATA Mean heart rate during sleep was 65.4 bpm.  IMPRESSIONS - No significant obstructive sleep apnea occurred during this study (AHI = 4.8/h). - Oxygen desaturation was noted during this study (Min O2 = 76%, Mean 97%). - Patient snored.  DIAGNOSIS - Primary Snoring  RECOMMENDATIONS - Manage for symptoms and snoring based on clinical judgment. - If test results are inconsistent with clinical impression, consider in-center sleep study. - Be careful with alcohol and sedatives that may interfere with normal sleep and respiration. - Sleep hygiene should be reviewed to assess factors that may improve sleep quality. - Weight management and regular exercise should be initiated or continued.  [Electronically signed] 04/13/2023 12:41 PM  Jetty Duhamel MD, ABSM Diplomate, American Board of Sleep Medicine NPI: [id number]                           Jetty Duhamel Diplomate, American Board of Sleep Medicine  ELECTRONICALLY SIGNED ON:  04/13/2023, 12:37 PM [facility] PH: [phone]   FX: [phone] ACCREDITED BY THE AMERICAN ACADEMY OF SLEEP MEDICINE

## 2023-04-18 ENCOUNTER — Telehealth: Payer: Self-pay

## 2023-04-18 NOTE — Telephone Encounter (Signed)
Care everywhere search 

## 2023-04-25 ENCOUNTER — Ambulatory Visit (INDEPENDENT_AMBULATORY_CARE_PROVIDER_SITE_OTHER): Payer: 59 | Admitting: Diagnostic Neuroimaging

## 2023-04-25 ENCOUNTER — Other Ambulatory Visit: Payer: Self-pay | Admitting: Diagnostic Neuroimaging

## 2023-04-25 VITALS — BP 111/69 | HR 70 | Ht 63.0 in | Wt 223.0 lb

## 2023-04-25 DIAGNOSIS — G43711 Chronic migraine without aura, intractable, with status migrainosus: Secondary | ICD-10-CM | POA: Diagnosis not present

## 2023-04-25 MED ORDER — RIZATRIPTAN BENZOATE 10 MG PO TBDP: 10.0000 mg | ORAL_TABLET | ORAL | 11 refills | Status: DC | PRN

## 2023-04-25 MED ORDER — NURTEC 75 MG PO TBDP: 75.0000 mg | ORAL_TABLET | Freq: Every day | ORAL | 6 refills | Status: AC | PRN

## 2023-04-25 NOTE — Progress Notes (Signed)
[facility]  PATIENT: Carly Perez DOB: 02-Aug-1991  REFERRING CLINICIAN: Avanell Shackleton, NP-C HISTORY FROM: patient  REASON FOR VISIT: new consult   HISTORICAL  CHIEF COMPLAINT:  Chief Complaint  Patient presents with   New Patient (Initial Visit)    Patient in room #7 and alone. Patient states she been having bad headaches that travels from the back of her head to the front. Patient states she has a headache today.    HISTORY OF PRESENT ILLNESS:   32 year old female here for evaluation of headaches.  Patient has had headaches since teenage years.  Headaches consist of pain in the back of her head rating to her eyes.  Headaches last hours to days at a time.  Headaches associated with throbbing sensation, nausea and photophobia.  No specific triggering factors.  She was having 1-2 headaches per month but have increased to almost daily basis for the past 3 months.  She averages about 5 to 6 hours of sleep per night.  Was tried on topiramate and sumatriptan without relief.   REVIEW OF SYSTEMS: Full 14 system review of systems performed and negative with exception of: as per HPI.  ALLERGIES: Allergies  Allergen Reactions   Cat Hair Extract Hives   Dog Epithelium (Canis Lupus Familiaris) Hives   Grass Pollen(K-O-R-T-Swt Vern) Other (See Comments) and Hives   Other Hives and Other (See Comments)    Dust mites   Shellfish-Derived Products Hives    HOME MEDICATIONS: Outpatient Medications Prior to Visit  Medication Sig Dispense Refill   albuterol (PROVENTIL) (2.5 MG/3ML) 0.083% nebulizer solution albuterol sulfate 2.5 mg/3 mL (0.083 %) solution for nebulization 75 mL 0   albuterol (VENTOLIN HFA) 108 (90 Base) MCG/ACT inhaler Inhale 1-2 puffs into the lungs every 4 (four) hours as needed. 8 g 1   budesonide-formoterol (SYMBICORT) 160-4.5 MCG/ACT inhaler Inhale 2 puffs into the lungs 2 (two) times daily. 1 each 3   escitalopram (LEXAPRO) 10 MG  tablet Take 1 tablet (10 mg total) by mouth daily. 30 tablet 2   montelukast (SINGULAIR) 10 MG tablet Take 1 tablet (10 mg total) by mouth at bedtime. 30 tablet 3   ondansetron (ZOFRAN) 4 MG tablet Take 1 tablet (4 mg total) by mouth every 8 (eight) hours as needed for nausea or vomiting. 5 tablet 0   pantoprazole (PROTONIX) 40 MG tablet Take 1 tablet (40 mg total) by mouth daily. 30 tablet 3   predniSONE (DELTASONE) 20 MG tablet Take 2 tablets (40 mg total) by mouth daily with breakfast. 10 tablet 0   promethazine (PHENERGAN) 12.5 MG tablet Take 1 tablet (12.5 mg total) by mouth every 6 (six) hours as needed (headache). 10 tablet 0   topiramate (TOPAMAX) 50 MG tablet Take 1 tablet (50 mg total) by mouth 2 (two) times daily. 30 tablet 2   SUMAtriptan (IMITREX) 25 MG tablet TAKE 1 TABLET BY MOUTH EVERY 2 HOURS AS NEEDED FOR MIGRAINE. MAY REPEAT IN 2 HOURS IF HEADACHE PERSISTS OR RECURS. 10 tablet 0   sucralfate (CARAFATE) 1 g tablet Take 1 tablet (1 g total) by mouth with breakfast, with lunch, and with evening meal for 7 days. (Patient not taking: Reported on 03/15/2023) 21 tablet 0   No facility-administered medications prior to visit.    PAST MEDICAL HISTORY: Past Medical History:  Diagnosis Date   Asthma    Depression 03/15/2023   Environmental and seasonal allergies 03/15/2023   Gastroesophageal reflux disease 03/15/2023   Gout  Sleep apnea     PAST SURGICAL HISTORY: Past Surgical History:  Procedure Laterality Date   CYST EXCISION     TONSILLECTOMY      FAMILY HISTORY: Family History  Problem Relation Age of Onset   ADD / ADHD Mother    Asthma Father    Depression Father     SOCIAL HISTORY: Social History   Socioeconomic History   Marital status: Single    Spouse name: Not on file   Number of children: Not on file   Years of education: Not on file   Highest education level: 12th grade  Occupational History   Not on file  Tobacco Use   Smoking status: Never    Smokeless tobacco: Never  Vaping Use   Vaping Use: Never used  Substance and Sexual Activity   Alcohol use: Never   Drug use: Never   Sexual activity: Never  Other Topics Concern   Not on file  Social History Narrative   Not on file   Social Determinants of Health   Financial Resource Strain: Low Risk  (04/08/2023)   Overall Financial Resource Strain (CARDIA)    Difficulty of Paying Living Expenses: Not hard at all  Food Insecurity: No Food Insecurity (04/08/2023)   Hunger Vital Sign    Worried About Running Out of Food in the Last Year: Never true    Ran Out of Food in the Last Year: Never true  Transportation Needs: No Transportation Needs (04/08/2023)   PRAPARE - Administrator, Civil Service (Medical): No    Lack of Transportation (Non-Medical): No  Physical Activity: Unknown (04/08/2023)   Exercise Vital Sign    Days of Exercise per Week: 0 days    Minutes of Exercise per Session: Not on file  Stress: Stress Concern Present (04/08/2023)   [employer] of Occupational Health - Occupational Stress Questionnaire    Feeling of Stress : Very much  Social Connections: Socially Isolated (04/08/2023)   Social Connection and Isolation Panel [NHANES]    Frequency of Communication with Friends and Family: More than three times a week    Frequency of Social Gatherings with Friends and Family: Once a week    Attends Religious Services: Never    Database administrator or Organizations: No    Attends Engineer, structural: Not on file    Marital Status: Never married  Intimate Partner Violence: Not on file     PHYSICAL EXAM  GENERAL EXAM/CONSTITUTIONAL: Vitals:  Vitals:   04/25/23 1247  BP: 111/69  Pulse: 70  Weight: 223 lb 0.3 oz (101.2 kg)  Height: 5\' 3"  (1.6 m)   Body mass index is 39.51 kg/m. Wt Readings from Last 3 Encounters:  04/25/23 223 lb 0.3 oz (101.2 kg)  04/12/23 220 lb (99.8 kg)  04/08/23 227 lb (103 kg)   Patient is in no distress; well  developed, nourished and groomed; neck is supple  CARDIOVASCULAR: Examination of carotid arteries is normal; no carotid bruits Regular rate and rhythm, no murmurs Examination of peripheral vascular system by observation and palpation is normal  EYES: Ophthalmoscopic exam of optic discs and posterior segments is normal; no papilledema or hemorrhages No results found.  MUSCULOSKELETAL: Gait, strength, tone, movements noted in Neurologic exam below  NEUROLOGIC: MENTAL STATUS:      No data to display         awake, alert, oriented to person, place and time recent and remote memory intact normal attention and concentration  language fluent, comprehension intact, naming intact fund of knowledge appropriate  CRANIAL NERVE:  2nd - no papilledema on fundoscopic exam; PHOTOSENSITIVE 2nd, 3rd, 4th, 6th - pupils equal and reactive to light, visual fields full to confrontation, extraocular muscles intact, no nystagmus 5th - facial sensation symmetric 7th - facial strength symmetric 8th - hearing intact 9th - palate elevates symmetrically, uvula midline 11th - shoulder shrug symmetric 12th - tongue protrusion midline  MOTOR:  normal bulk and tone, full strength in the BUE, BLE  SENSORY:  normal and symmetric to light touch, temperature, vibration  COORDINATION:  finger-nose-finger, fine finger movements normal  REFLEXES:  deep tendon reflexes TRACE and symmetric  GAIT/STATION:  narrow based gait     DIAGNOSTIC DATA (LABS, IMAGING, TESTING) - I reviewed patient records, labs, notes, testing and imaging myself where available.  Lab Results  Component Value Date   WBC 7.4 02/04/2023   HGB 13.2 02/04/2023   HCT 41.8 02/04/2023   MCV 87.3 02/04/2023   PLT 296 02/04/2023      Component Value Date/Time   NA 139 02/04/2023 1107   K 3.6 02/04/2023 1107   CL 105 02/04/2023 1107   CO2 26 02/04/2023 1107   GLUCOSE 82 02/04/2023 1107   BUN 8 02/04/2023 1107   CREATININE  0.58 02/04/2023 1107   CALCIUM 8.8 (L) 02/04/2023 1107   PROT 7.8 02/04/2023 1107   ALBUMIN 3.8 02/04/2023 1107   AST 28 02/04/2023 1107   ALT 28 02/04/2023 1107   ALKPHOS 60 02/04/2023 1107   BILITOT 0.5 02/04/2023 1107   GFRNONAA >60 02/04/2023 1107   GFRAA >60 08/10/2020 2240   No results found for: "CHOL", "HDL", "LDLCALC", "LDLDIRECT", "TRIG", "CHOLHDL" No results found for: "HGBA1C" No results found for: "VITAMINB12" No results found for: "TSH"   04/05/23 MRI brain [I reviewed images myself and agree with interpretation. -VRP]  1. Mildly motion degraded exam. 2. Unremarkable non-contrast MRI appearance of the brain. No evidence of an acute intracranial abnormality. 3. Incompletely assessed cervical spondylosis. At C4-C5, a central disc protrusion contributes to apparent moderate spinal canal stenosis.    ASSESSMENT AND PLAN  32 y.o. year old female here with:  Meds tried: topiramate, sumatriptan, lexapro, ibuprofen, tylenol  Dx:  1. Intractable chronic migraine without aura and with status migrainosus     PLAN:  CHRONIC MIGRAINE WITHOUT AURA  MIGRAINE TREATMENT PLAN:  MIGRAINE PREVENTION  LIFESTYLE CHANGES -Stop or avoid smoking -Decrease or avoid caffeine / alcohol -Eat and sleep on a regular schedule -Exercise several times per week - continue topiramate 50mg  50mg  twice a day; drink plenty of water  Consider 2nd line - rimegepant (Nurtec) 75mg  every other day - atogepant Bennie Pierini) 60mg  daily - erenumab (Aimovig) 70mg  monthly (may increase to 140mg  monthly) - fremanezumab (Ajovy) 225mg  monthly (or 675mg  every 3 months) - galazanezumab (Emgality) 240mg  loading dose; then 120mg  monthly  MIGRAINE RESCUE  - ibuprofen, tylenol as needed - stop sumatriptan (not effective) - start rizatriptan (Maxalt) 10mg  as needed for breakthrough headache; may repeat x 1 after 2 hours; max 2 tabs per day or 8 per month - start rimegepant (Nurtec) 75mg  as needed for  breakthrough headache; max 8 per month  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 May repeat in 2 hours if needed    Dispense:  9 tablet    Refill:  11   Rimegepant Sulfate (NURTEC) 75 MG TBDP    Sig: Take 1 tablet (75 mg total) by mouth daily as needed.    Dispense:  8 tablet    Refill:  6    Return in about 6 months (around 10/25/2023) for with NP.    Suanne Marker, MD 04/25/2023, 1:29 PM Certified in Neurology, Neurophysiology and Neuroimaging  [facility] [address], [address] [place], [place] [address] [phone]

## 2023-04-25 NOTE — Patient Instructions (Signed)
CHRONIC MIGRAINE WITHOUT AURA  MIGRAINE TREATMENT PLAN:  MIGRAINE PREVENTION  LIFESTYLE CHANGES -Stop or avoid smoking -Decrease or avoid caffeine / alcohol -Eat and sleep on a regular schedule -Exercise several times per week - continue topiramate   twice a day; drink plenty of water  Consider 2nd line - rimegepant (Nurtec)  every other day - atogepant (Qulipta)  daily - erenumab (Aimovig)  monthly (may increase to  monthly) - fremanezumab (Ajovy)  monthly (or  every 3 months) - galazanezumab (Emgality)  loading dose; then  monthly   MIGRAINE RESCUE  - ibuprofen, tylenol as needed - stop sumatriptan (not effective) - start rizatriptan (Maxalt)  as needed for breakthrough headache; may repeat x 1 after 2 hours; max 2 tabs per day or 8 per month - start rimegepant (Nurtec)  as needed for breakthrough headache; max 8 per month  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

## 2023-04-26 ENCOUNTER — Other Ambulatory Visit: Payer: Self-pay | Admitting: Family Medicine

## 2023-04-26 ENCOUNTER — Encounter: Payer: Self-pay | Admitting: Diagnostic Neuroimaging

## 2023-04-26 DIAGNOSIS — Z8669 Personal history of other diseases of the nervous system and sense organs: Secondary | ICD-10-CM

## 2023-04-26 DIAGNOSIS — G8929 Other chronic pain: Secondary | ICD-10-CM

## 2023-04-26 LAB — VITAMIN B12: Vitamin B-12: 871 pg/mL (ref 232–1245)

## 2023-04-26 LAB — TSH RFX ON ABNORMAL TO FREE T4: TSH: 0.736 u[IU]/mL (ref 0.450–4.500)

## 2023-04-29 ENCOUNTER — Encounter: Payer: Self-pay | Admitting: Orthopedic Surgery

## 2023-04-29 ENCOUNTER — Ambulatory Visit (INDEPENDENT_AMBULATORY_CARE_PROVIDER_SITE_OTHER): Payer: 59 | Admitting: Orthopedic Surgery

## 2023-04-29 ENCOUNTER — Other Ambulatory Visit (INDEPENDENT_AMBULATORY_CARE_PROVIDER_SITE_OTHER): Payer: 59

## 2023-04-29 VITALS — BP 115/75 | HR 76 | Ht 63.0 in | Wt 223.0 lb

## 2023-04-29 DIAGNOSIS — M542 Cervicalgia: Secondary | ICD-10-CM | POA: Diagnosis not present

## 2023-04-29 MED ORDER — METHYLPREDNISOLONE 4 MG PO TBPK: ORAL_TABLET | ORAL | 0 refills | Status: DC

## 2023-04-29 NOTE — Progress Notes (Signed)
Orthopedic Spine Surgery Office Note  Assessment: Patient is a 32 y.o. female with neck pain that radiates into the right upper extremity to the fingers. Suspect radiculopathy   Plan: -Explained that initially conservative treatment is tried as a significant number of patients may experience relief with these treatment modalities. Discussed that the conservative treatments include:  -activity modification  -physical therapy  -over the counter pain medications  -medrol dosepak  -cervical steroid injections -Patient has tried activity modification -Recommended she use tylenol. Prescribed medrol dosepak -If she is not any better at our next visit, will recommend MRI of the cervical spine to evaluate for radiculopathy -Patient should return to office in 6 weeks, x-rays at next visit: none   Patient expressed understanding of the plan and all questions were answered to the patient's satisfaction.   ___________________________________________________________________________   History:  Patient is a 32 y.o. female who presents today for cervical spine. Patient has had 3 months of neck pain that radiates into her right upper extremity. There as no trauma or injury that preceded the onset of pain. Pain goes into the right lateral shoulder, lateral arm, dorsal forearm, and into all the fingers. There is no radiating pain in the left upper extremity. She gets numbness and paresthesias radiating down the entire right arm. Feels them in all the fingers as well.    Weakness: yes, feels her right arm is slightly weaker than the left Difficulty with fine motor skills (e.g., buttoning shirts, handwriting): denies Symptoms of imbalance: denies Paresthesias and numbness: yes, down the RUE in the same distribution as the pain Bowel or bladder incontinence: denies Saddle anesthesia: denies  Treatments tried: activity modification  Review of systems: Denies fevers and chills, night sweats,  unexplained weight loss, history of cancer. Has had pain that wakes her at night  Past medical history: Migraines Depression/anxiety GERD  Allergies: NKDA  Past surgical history:  Right breast cyst excision Tonsillectomy  Social history: Denies use of nicotine product (smoking, vaping, patches, smokeless) Alcohol use: denies Denies recreational drug use  Physical Exam:  General: no acute distress, appears stated age Neurologic: alert, answering questions appropriately, following commands Respiratory: unlabored breathing on room air, symmetric chest rise Psychiatric: appropriate affect, normal cadence to speech   MSK (spine):  -Strength exam      Left  Right Grip strength               5/5  5/5 Interosseus   5/5   5/5 Wrist extension  5/5  5/5 Wrist flexion   5/5  5/5 Elbow flexion   5/5  5/5 Deltoid    5/5  5/5  EHL    5/5  5/5 TA    5/5  5/5 GSC    5/5  5/5 Knee extension  5/5  5/5 Hip flexion   5/5  5/5  -Sensory exam    Sensation intact to light touch in L3-S1 nerve distributions of bilateral lower extremities  Sensation intact to light touch in C5-T1 nerve distributions of bilateral upper extremities  -Brachioradialis DTR: 2/4 on the left, 2/4 on the right -Biceps DTR: 2/4 on the left, 2/4 on the right -Achilles DTR: 2/4 on the left, 2/4 on the right -Patellar tendon DTR: 2/4 on the left, 2/4 on the right  -Spurling: negative bilaterally -Hoffman sign: negative bilaterally -Clonus: no beats bilaterally  -Interosseous wasting: none seen -Grip and release test: negative -Romberg: negative -Gait: normal  Left shoulder exam: no pain through range of motion Right shoulder  exam: no pain through range of motion, negative jobe, negative belly press, no weakness with external rotation with arm at side  Tinel's at wrist: negative bilaterally Phalen's at wrist: negative bilaterally  Durkan's: negative bilaterally  Tinel's at elbow: negative  bilaterally  Imaging: XR of the cervical spine from 04/29/2023 was independently reviewed and interpreted, showing no significant degenerative changes. No fracture or dislocation. No evidence of instability on flexion/extension views.    Patient name: Carly Perez Patient MRN: [id number] Date of visit: 04/29/23

## 2023-05-04 ENCOUNTER — Other Ambulatory Visit: Payer: Self-pay | Admitting: Family Medicine

## 2023-05-04 DIAGNOSIS — F314 Bipolar disorder, current episode depressed, severe, without psychotic features: Secondary | ICD-10-CM

## 2023-05-10 ENCOUNTER — Encounter: Payer: Self-pay | Admitting: Diagnostic Neuroimaging

## 2023-05-13 NOTE — Telephone Encounter (Signed)
PA completed on [facility] KEY:[id number] Will await determination

## 2023-05-13 NOTE — Telephone Encounter (Signed)
Checking with infusion suite to determine schedule to get patient worked in for a migraine infusion.

## 2023-06-10 ENCOUNTER — Ambulatory Visit: Payer: 59 | Admitting: Orthopedic Surgery

## 2023-06-12 ENCOUNTER — Ambulatory Visit: Payer: 59 | Admitting: Family Medicine

## 2023-07-05 ENCOUNTER — Ambulatory Visit: Payer: 59 | Admitting: Family Medicine

## 2023-07-24 ENCOUNTER — Emergency Department (HOSPITAL_COMMUNITY)
Admission: EM | Admit: 2023-07-24 | Discharge: 2023-07-24 | Disposition: A | Discharge status: Home / Self Care | Payer: 59 | Attending: Emergency Medicine | Admitting: Emergency Medicine

## 2023-07-24 ENCOUNTER — Other Ambulatory Visit: Payer: Self-pay

## 2023-07-24 DIAGNOSIS — M545 Low back pain, unspecified: Secondary | ICD-10-CM | POA: Insufficient documentation

## 2023-07-24 DIAGNOSIS — M5459 Other low back pain: Secondary | ICD-10-CM | POA: Diagnosis not present

## 2023-07-24 LAB — URINALYSIS, ROUTINE W REFLEX MICROSCOPIC
Bilirubin Urine: NEGATIVE
Glucose, UA: NEGATIVE mg/dL
Hgb urine dipstick: NEGATIVE
Ketones, ur: NEGATIVE mg/dL
Leukocytes,Ua: NEGATIVE
Nitrite: NEGATIVE
Protein, ur: NEGATIVE mg/dL
Specific Gravity, Urine: 1.029 (ref 1.005–1.030)
pH: 5 (ref 5.0–8.0)

## 2023-07-24 LAB — PREGNANCY, URINE: Preg Test, Ur: NEGATIVE

## 2023-07-24 MED ORDER — METHOCARBAMOL 500 MG PO TABS
1000.0000 mg | ORAL_TABLET | Freq: Once | ORAL | Status: AC
  Administered 2023-07-24: 1000 mg via ORAL
  Filled 2023-07-24: qty 2

## 2023-07-24 MED ORDER — KETOROLAC TROMETHAMINE 30 MG/ML IJ SOLN
30.0000 mg | Freq: Once | INTRAMUSCULAR | Status: AC
  Administered 2023-07-24: 30 mg via INTRAMUSCULAR
  Filled 2023-07-24: qty 1

## 2023-07-24 MED ORDER — MELOXICAM 7.5 MG PO TABS: 7.5000 mg | ORAL_TABLET | Freq: Every day | ORAL | 0 refills | Status: AC

## 2023-07-24 MED ORDER — METHOCARBAMOL 500 MG PO TABS: 1000.0000 mg | ORAL_TABLET | Freq: Three times a day (TID) | ORAL | 0 refills | Status: DC | PRN

## 2023-07-24 NOTE — ED Notes (Signed)
Pts urine has been sent to lab.

## 2023-07-24 NOTE — Discharge Instructions (Addendum)
Please read and follow all provided instructions.  Your diagnoses today include:  1. Acute left-sided low back pain without sciatica     Tests performed today include: Vital signs - see below for your results today Urine test - neg for infection and pregnancy  Medications prescribed:  Robaxin (methocarbamol) - muscle relaxer medication  DO NOT drive or perform any activities that require you to be awake and alert because this medicine can make you drowsy.   Meloxicam - anti-inflammatory pain medication  You have been prescribed an anti-inflammatory medication or NSAID. Take with food. Do not take aspirin, ibuprofen, or naproxen if taking this medication. Take smallest effective dose for the shortest duration needed for your pain. Stop taking if you experience stomach pain or vomiting.   Take any prescribed medications only as directed.  Home care instructions:  Follow any educational materials contained in this packet Please rest, use ice or heat on your back for the next several days Do not lift, push, pull anything more than 10 pounds for the next week  Follow-up instructions: Please follow-up with your primary care provider in the next 1 week for further evaluation of your symptoms.   Return instructions:  SEEK IMMEDIATE MEDICAL ATTENTION IF YOU HAVE: New numbness, tingling, weakness, or problem with the use of your arms or legs Severe back pain not relieved with medications Loss control of your bowels or bladder Increasing pain in any areas of the body (such as chest or abdominal pain) Shortness of breath, dizziness, or fainting.  Worsening nausea (feeling sick to your stomach), vomiting, fever, or sweats Any other emergent concerns regarding your health   Additional Information:  Your vital signs today were: BP 136/80 (BP Location: Left Arm)   Pulse 90   Temp 98.3 F (36.8 C) (Oral)   Resp 16   Ht 5\' 3"  (1.6 m)   Wt 108.9 kg   SpO2 100%   BMI 42.51 kg/m  If  your blood pressure (BP) was elevated above 135/85 this visit, please have this repeated by your doctor within one month. --------------

## 2023-07-24 NOTE — ED Provider Notes (Signed)
[place] EMERGENCY DEPARTMENT AT [facility] Provider Note   CSN: [id number] Arrival date & time: 07/24/23  1821     History  No chief complaint on file.   Carly Perez is a 32 y.o. female.  Patient with history of cervical radiculopathy, works as a Lawyer, presents with 1 week history of lower back and left sided lower back pain.  She denies acute injuries or falls.  Pain is gradually gotten worse.  It is worse with movements.  She has chronic anterior abdominal soreness from fibroids which are unchanged.  Over-the-counter medications have not been helping.  No weakness, numbness, or tingling into the legs.  No pain into the legs.  No urinary symptoms or history of kidney stones.  No dysuria, increased frequency or urgency or hematuria.  She denies vomiting or diarrhea.  When the pain is severe she feels lightheaded but has not passed out.       Home Medications Prior to Admission medications   Medication Sig Start Date End Date Taking? Authorizing Provider  albuterol (PROVENTIL) (2.5 MG/3ML) 0.083% nebulizer solution albuterol sulfate 2.5 mg/3 mL (0.083 %) solution for nebulization 03/15/23   Henson, [name] L, NP-C  albuterol (VENTOLIN HFA) 108 (90 Base) MCG/ACT inhaler Inhale 1-2 puffs into the lungs every 4 (four) hours as needed. 03/15/23   Henson, [name] L, NP-C  budesonide-formoterol (SYMBICORT) 160-4.5 MCG/ACT inhaler Inhale 2 puffs into the lungs 2 (two) times daily. 03/15/23   Henson, [name] L, NP-C  escitalopram (LEXAPRO) 10 MG tablet TAKE 1 TABLET BY MOUTH EVERY DAY 05/06/23   Henson, [name] L, NP-C  methylPREDNISolone (MEDROL DOSEPAK) 4 MG TBPK tablet Take as prescribed on the box 05/20/23   London Sheer, MD  montelukast (SINGULAIR) 10 MG tablet Take 1 tablet (10 mg total) by mouth at bedtime. 03/15/23   Henson, [name] L, NP-C  ondansetron (ZOFRAN) 4 MG tablet Take 1 tablet (4 mg total) by mouth every 8 (eight) hours as needed for nausea or vomiting.  02/04/23   Sloan Leiter, DO  pantoprazole (PROTONIX) 40 MG tablet Take 1 tablet (40 mg total) by mouth daily. 03/15/23   Henson, [name] L, NP-C  predniSONE (DELTASONE) 20 MG tablet Take 2 tablets (40 mg total) by mouth daily with breakfast. 04/12/23   Henson, [name] L, NP-C  promethazine (PHENERGAN) 12.5 MG tablet Take 1 tablet (12.5 mg total) by mouth every 6 (six) hours as needed (headache). 02/04/23   Sloan Leiter, DO  Rimegepant Sulfate (NURTEC) 75 MG TBDP Take 1 tablet (75 mg total) by mouth daily as needed. 04/25/23   Penumalli, Glenford Bayley, MD  rizatriptan (MAXALT-MLT) 10 MG disintegrating tablet Take 1 tablet (10 mg total) by mouth as needed for migraine. May repeat in 2 hours if needed 04/25/23   Penumalli, Glenford Bayley, MD  topiramate (TOPAMAX) 50 MG tablet Take 1 tablet (50 mg total) by mouth 2 (two) times daily. 04/12/23   Henson, [name] L, NP-C      Allergies    Cat hair extract, Dog epithelium (canis lupus familiaris), Grass pollen(k-o-r-t-swt vern), Other, and Shellfish-derived products    Review of Systems   Review of Systems  Physical Exam Updated Vital Signs BP 136/80 (BP Location: Left Arm)   Pulse 90   Temp 98.3 F (36.8 C) (Oral)   Resp 16   Ht 5\' 3"  (1.6 m)   Wt 108.9 kg   SpO2 100%   BMI 42.51 kg/m  Physical Exam Vitals and nursing  note reviewed.  Constitutional:      Appearance: She is well-developed.  HENT:     Head: Normocephalic and atraumatic.  Eyes:     Conjunctiva/sclera: Conjunctivae normal.  Pulmonary:     Effort: Pulmonary effort is normal.  Abdominal:     Palpations: Abdomen is soft.     Tenderness: There is abdominal tenderness (Mild, middle lower). There is no guarding or rebound.  Musculoskeletal:     Cervical back: Normal range of motion and neck supple. No tenderness. Normal range of motion.     Thoracic back: Tenderness present. No spasms. Normal range of motion.     Lumbar back: Spasms and tenderness present. Decreased range of motion.        Back:     Comments: No step-off noted with palpation of spine.   Skin:    General: Skin is warm and dry.     Findings: No rash.  Neurological:     Mental Status: She is alert.     Sensory: No sensory deficit.     Deep Tendon Reflexes: Reflexes are normal and symmetric.     Comments: 5/5 strength in entire lower extremities bilaterally. No sensation deficit.      ED Results / Procedures / Treatments   Labs (all labs ordered are listed, but only abnormal results are displayed) Labs Reviewed  URINALYSIS, ROUTINE W REFLEX MICROSCOPIC  PREGNANCY, URINE    EKG None  Radiology No results found.  Procedures Procedures    Medications Ordered in ED Medications  ketorolac (TORADOL) 30 MG/ML injection 30 mg (has no administration in time range)  methocarbamol (ROBAXIN) tablet 1,000 mg (has no administration in time range)    ED Course/ Medical Decision Making/ A&P    Patient seen and examined. History obtained directly from patient.   Labs/EKG: Ordered UA, preg.  Imaging: None ordered  Medications/Fluids: Ordered: IM toradol, PO robaxin  Most recent vital signs reviewed and are as follows: BP 136/80 (BP Location: Left Arm)   Pulse 90   Temp 98.3 F (36.8 C) (Oral)   Resp 16   Ht 5\' 3"  (1.6 m)   Wt 108.9 kg   SpO2 100%   BMI 42.51 kg/m   Initial impression: likely MSK lower back pain, will check urine.   9:21 PM patient reassessed.  Appears stable.  Personally reviewed and interpreted UA without compelling signs of infection or blood, pregnancy negative.  Plan discharged home with symptomatic treatment.  Home treatment plan: Patient was counseled on back pain precautions and advised to do activity as tolerated but avoid strenuous activity and do not lift, push, or pull heavy objects more than 10 pounds for the next week.  Patient counseled to use ice or heat on back as needed for pain and spasm.    Medications prescribed: Robaxin for muscle pain and spasm.  Patient counseled on proper use of muscle relaxant medication.  They were requested not to drink alcohol, drive any vehicle, or do any dangerous activities while taking this medication due to potential drowsiness and unintended.  Patient verbalized understanding.  Return instructions discussed with patient: Urged to return with worsening severe pain, loss of bowel or bladder control, trouble walking, development of weakness in the legs, or with any other concerns.  Follow-up instructions discussed with patient: Patient urged to follow-up with PCP if pain does not improve with treatment and rest or if pain becomes recurrent.  Medical Decision Making Amount and/or Complexity of Data Reviewed Labs: ordered.  Risk Prescription drug management.   Patient with back pain without radicular features. No neurological deficits. Patient is ambulatory. No warning symptoms of back pain including: fecal incontinence, urinary retention or overflow incontinence, night sweats, waking from sleep with back pain, unexplained fevers or weight loss, h/o cancer, IVDU, recent trauma. No concern for cauda equina, epidural abscess, or other serious cause of back pain. Conservative measures such as rest, ice/heat and pain medicine indicated with PCP follow-up if no improvement with conservative management.          Final Clinical Impression(s) / ED Diagnoses Final diagnoses:  Acute left-sided low back pain without sciatica    Rx / DC Orders ED Discharge Orders          Ordered    methocarbamol (ROBAXIN) 500 MG tablet  Every 8 hours PRN        07/24/23 2120    meloxicam (MOBIC) 7.5 MG tablet  Daily        07/24/23 2120              Renne Crigler, PA-C 07/24/23 2122    Elayne Snare K, DO 07/24/23 2315

## 2023-07-24 NOTE — ED Triage Notes (Signed)
Pt reports continuous lt side pain primarily in her lt flank and low back area but feels discomfort down into her lt leg. 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 Now having difficulty walking and bearing weight.

## 2023-07-26 ENCOUNTER — Encounter: Payer: Self-pay | Admitting: Family Medicine

## 2023-07-29 ENCOUNTER — Encounter: Payer: Self-pay | Admitting: *Deleted

## 2023-07-29 ENCOUNTER — Telehealth: Payer: Self-pay | Admitting: *Deleted

## 2023-07-29 NOTE — Progress Notes (Signed)
Called pt to make ER f/u appt no answer LMOM to return call to make f/u appt w/ [name].Marland KitchenRaechel Chute

## 2023-07-29 NOTE — Transitions of Care (Post Inpatient/ED Visit) (Signed)
07/29/2023  Name: Carly Perez MRN: [id number] DOB: May 29, 1991  Today's TOC FU Call Status: Today's TOC FU Call Status:: Successful TOC FU Call Competed TOC FU Call Complete Date: 07/29/23  ED EMMI Red Alert notification on 07/29/23 from ED visit 07/24/23- automated EMMI call placed 07/26/23: "No scheduled follow up"  Transition Care Management Follow-up Telephone Call Date of Discharge: 07/24/23 Discharge Facility: [facility]) Type of Discharge: Emergency Department Reason for ED Visit: Orthopedic Conditions (back pain) Orthopedic/Injury Diagnosis: Sprain or Strain How have you been since you were released from the hospital?: Same ("I am still in pain.... not working; just doing the best I can to get by.") Any questions or concerns?: No  Items Reviewed: Did you receive and understand the discharge instructions provided?: Yes (thoroughly reviewed with patient who verbalizes good understanding of same) Medications obtained,verified, and reconciled?: Yes (Medications Reviewed) (Full medication reconciliation/ review completed; no concerns or discrepancies identified; confirmed patient obtained/ is taking all newly Rx'd medications as instructed; self-manages medications and denies questions/ concerns around medications today) Any new allergies since your discharge?: No Dietary orders reviewed?: Yes Type of Diet Ordered:: "Healthy as possible" Do you have support at home?: Yes People in Home: spouse Name of Support/Comfort Primary Source: Reports independent in self-care activities; resides with female spouse who assists as/ if needed/ indicated (Reports independent in self-care activities; resides with female spouse who assists as/ if needed/ indicated)  Medications Reviewed Today: Medications Reviewed Today     Reviewed by Michaela Corner, RN (Registered Nurse) on 07/29/23 at 1559  Med List Status: <None>   Medication Order Taking? Sig Documenting Provider Last Dose  Status Informant  albuterol (PROVENTIL) (2.5 MG/3ML) 0.083% nebulizer solution [id number] Yes albuterol sulfate 2.5 mg/3 mL (0.083 %) solution for nebulization Henson, [name] L, NP-C Taking Active   albuterol (VENTOLIN HFA) 108 (90 Base) MCG/ACT inhaler [id number] Yes Inhale 1-2 puffs into the lungs every 4 (four) hours as needed. Avanell Shackleton, NP-C Taking Active   budesonide-formoterol (SYMBICORT) 160-4.5 MCG/ACT inhaler [id number] Yes Inhale 2 puffs into the lungs 2 (two) times daily. Avanell Shackleton, NP-C Taking Active   escitalopram (LEXAPRO) 10 MG tablet [id number] Yes TAKE 1 TABLET BY MOUTH EVERY DAY Henson, [name] L, NP-C Taking Active   meloxicam (MOBIC) 7.5 MG tablet [id number] Yes Take 1 tablet (7.5 mg total) by mouth daily. Renne Crigler, PA-C Taking Active   methocarbamol (ROBAXIN) 500 MG tablet [id number] Yes Take 2 tablets (1,000 mg total) by mouth every 8 (eight) hours as needed for muscle spasms. Renne Crigler, PA-C Taking Active   methylPREDNISolone (MEDROL DOSEPAK) 4 MG TBPK tablet [id number] Yes Take as prescribed on the box London Sheer, MD Taking Active   montelukast (SINGULAIR) 10 MG tablet [id number] Yes Take 1 tablet (10 mg total) by mouth at bedtime. Henson, [name] L, NP-C Taking Active   ondansetron (ZOFRAN) 4 MG tablet [id number] Yes Take 1 tablet (4 mg total) by mouth every 8 (eight) hours as needed for nausea or vomiting. Sloan Leiter, DO Taking Active   pantoprazole (PROTONIX) 40 MG tablet [id number] Yes Take 1 tablet (40 mg total) by mouth daily. Henson, [name] L, NP-C Taking Active   predniSONE (DELTASONE) 20 MG tablet [id number] Yes Take 2 tablets (40 mg total) by mouth daily with breakfast. Suezanne Jacquet, [name] L, NP-C Taking Active   promethazine (PHENERGAN) 12.5 MG tablet [id number] Yes Take 1 tablet (12.5 mg total) by mouth every 6 (six) hours as needed (headache). Tanda Rockers  A, DO Taking Active   Rimegepant Sulfate (NURTEC) 75 MG TBDP [id number] Yes Take 1 tablet (75 mg  total) by mouth daily as needed. Penumalli, Glenford Bayley, MD Taking Active   rizatriptan (MAXALT-MLT) 10 MG disintegrating tablet [id number]  Take 1 tablet (10 mg total) by mouth as needed for migraine. May repeat in 2 hours if needed Suanne Marker, MD  Active            Med Note Marilu Favre Jul 29, 2023  3:42 PM) 08/29/23: Reports during [facility]  call she continues taking as prescribed  topiramate (TOPAMAX) 50 MG tablet [id number] Yes Take 1 tablet (50 mg total) by mouth 2 (two) times daily. Avanell Shackleton, NP-C Taking Active            Home Care and Equipment/Supplies: Were Home Health Services Ordered?: No Any new equipment or medical supplies ordered?: No  Functional Questionnaire: Do you need assistance with bathing/showering or dressing?: No Do you need assistance with meal preparation?: No Do you need assistance with eating?: No Do you have difficulty maintaining continence: No Do you need assistance with getting out of bed/getting out of a chair/moving?: No Do you have difficulty managing or taking your medications?: No  Follow up appointments reviewed: PCP Follow-up appointment confirmed?: Yes (care coordination outreach in real-time with scheduling care guide to successfully schedule hospital follow up PCP appointment 08/09/23- verified this is first available slot per scheduling care guide team) Date of PCP follow-up appointment?: 08/09/23 Follow-up Provider: covering APP for PCP Specialist Hospital Follow-up appointment confirmed?: NA Do you need transportation to your follow-up appointment?: No Do you understand care options if your condition(s) worsen?: Yes-patient verbalized understanding  SDOH Interventions Today    Flowsheet Row Most Recent Value  SDOH Interventions   Food Insecurity Interventions Intervention Not Indicated  Transportation Interventions Intervention Not Indicated  [reports her spouser provides transportation]      TOC Interventions Today     Flowsheet Row Most Recent Value  TOC Interventions   TOC Interventions Discussed/Reviewed TOC Interventions Discussed, Arranged PCP follow up less than 12 days/Care Guide scheduled  [Arranged follow up for 08/09/23- first available slot per scheduling care guide team]      Interventions Today    Flowsheet Row Most Recent Value  Chronic Disease   Chronic disease during today's visit Other  [back pain]  General Interventions   General Interventions Discussed/Reviewed General Interventions Discussed, Doctor Visits  Doctor Visits Discussed/Reviewed Doctor Visits Discussed, PCP  PCP/Specialist Visits Compliance with follow-up visit  Nutrition Interventions   Nutrition Discussed/Reviewed Nutrition Discussed  Pharmacy Interventions   Pharmacy Dicussed/Reviewed Pharmacy Topics Discussed  [Full medication review with updating medication list in EHR per patient report]      Caryl Pina, RN, BSN, CCRN Alumnus RN CM Care Coordination/ Transition of Care- [facility] Care Management [phone]: direct office

## 2023-08-02 DIAGNOSIS — F649 Gender identity disorder, unspecified: Secondary | ICD-10-CM | POA: Diagnosis not present

## 2023-08-02 DIAGNOSIS — N921 Excessive and frequent menstruation with irregular cycle: Secondary | ICD-10-CM | POA: Diagnosis not present

## 2023-08-04 IMAGING — CT CT ABD-PELV W/ CM
2 of 4 series · 15 of 46 positions shown, 17 images · IV contrast (APPLIED)
Comparison: CT angiography of the chest from Monday May, 2021,
abdominal sonogram from Saturday October, 2020.

CLINICAL DATA: Acute abdominal pain, nonlocalized abdominal pain in
a 29-year-old female.

EXAM:
CT ABDOMEN AND PELVIS WITH CONTRAST
TECHNIQUE: Multidetector CT imaging of the abdomen and pelvis was performed
using the standard protocol following bolus administration of
intravenous contrast.
CONTRAST:  100mL OMNIPAQUE IOHEXOL 350 MG/ML SOLN

[Series 3: abd/ pelvis 5.0 i30f 2 · axial · 0.96mm/px · z∈[+696,+1151]mm · 12 of 101 slices shown, 14 images]
[im 5/101  soft-tissue]
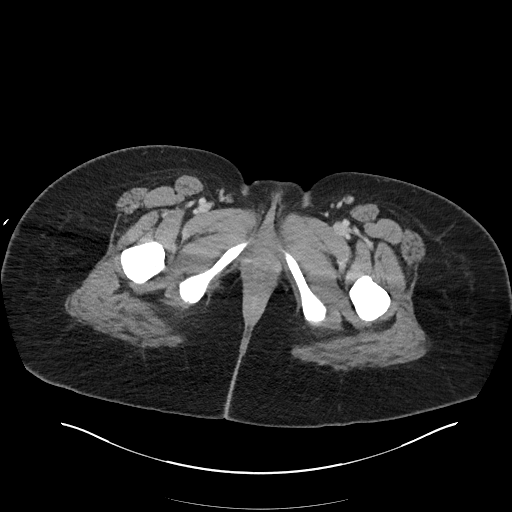
[im 5/101  bone]
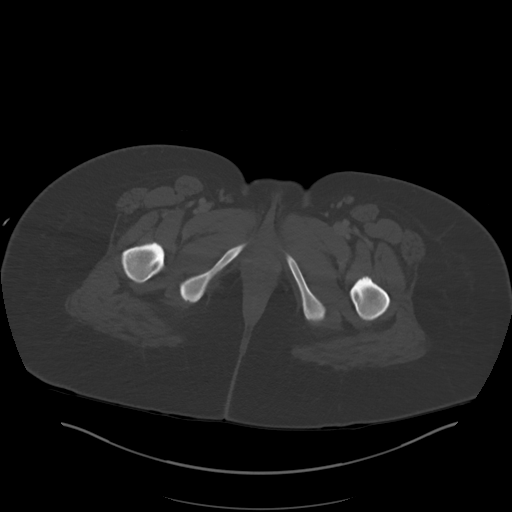
[im 14/101  soft-tissue]
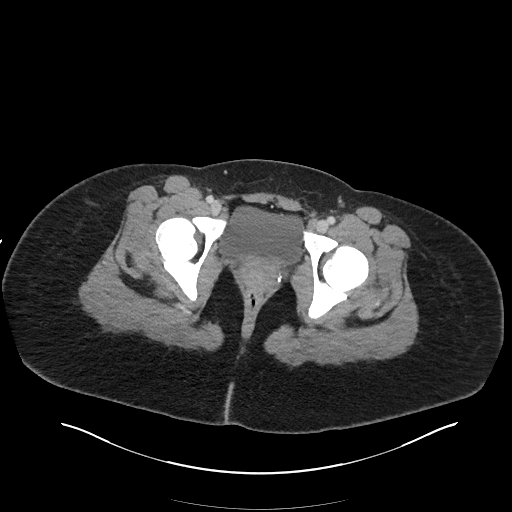
[im 22/101  soft-tissue]
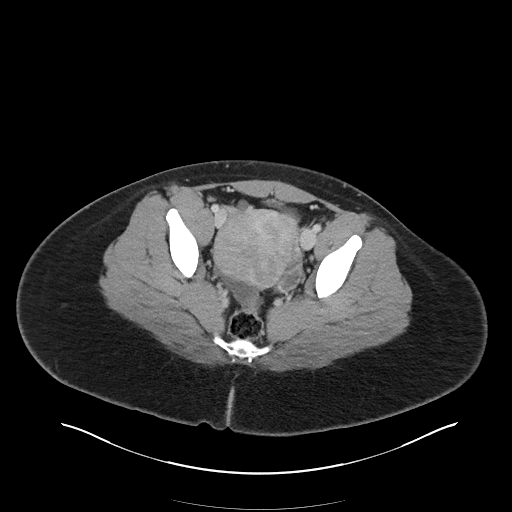
[im 31/101  soft-tissue]
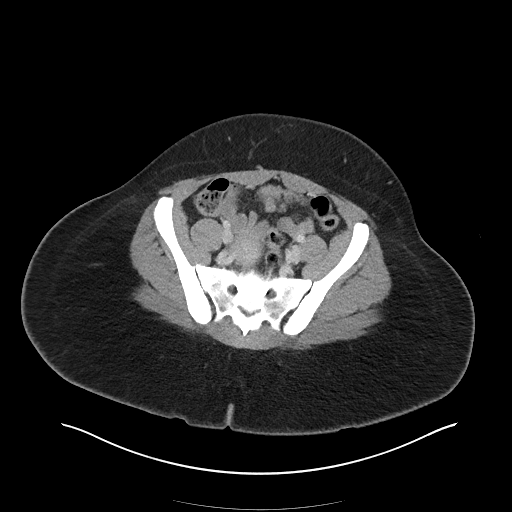
[im 40/101  soft-tissue]
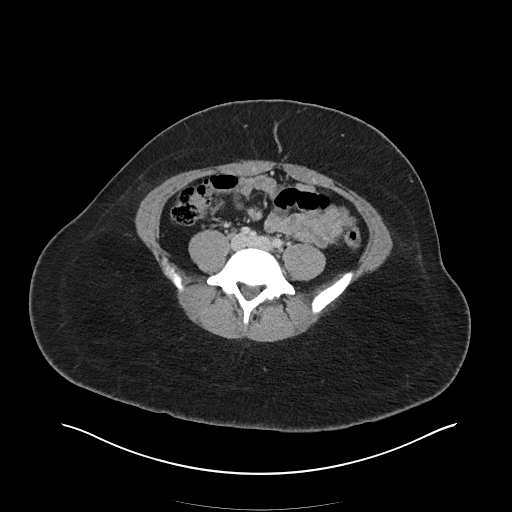
[im 48/101  soft-tissue]
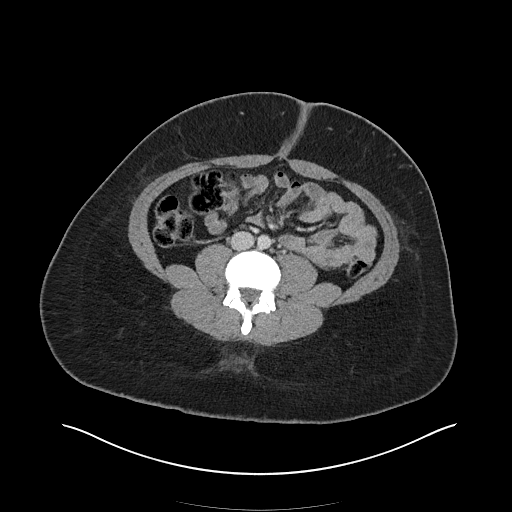
[im 53/101  soft-tissue]
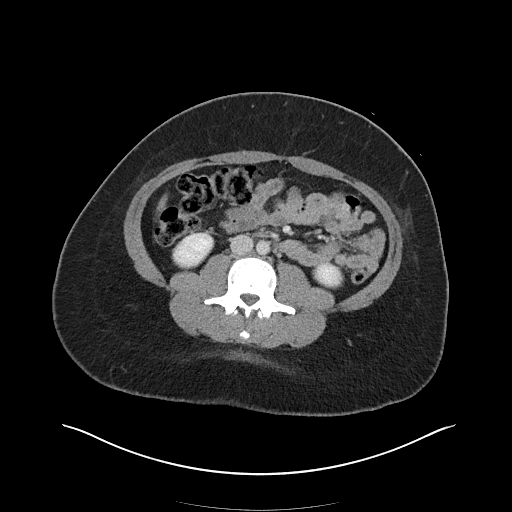
[im 61/101  soft-tissue]
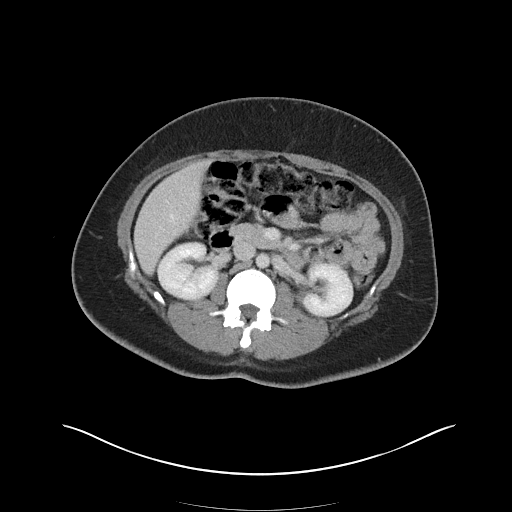
[im 70/101  soft-tissue]
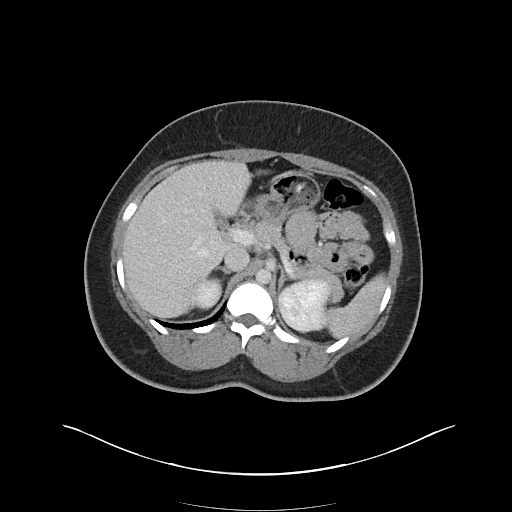
[im 70/101  bone]
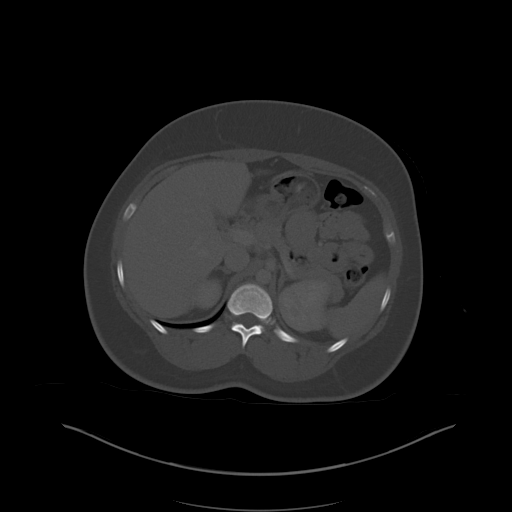
[im 79/101  soft-tissue]
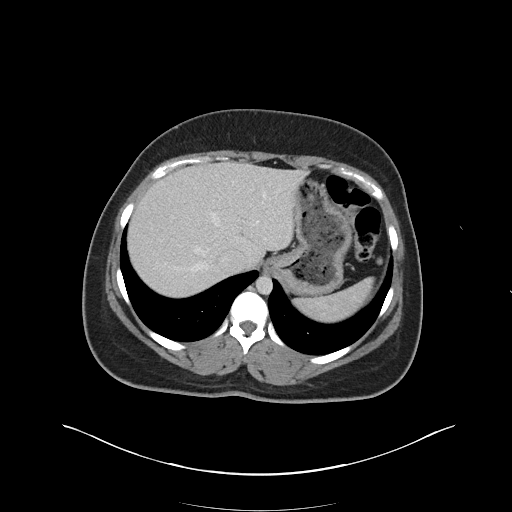
[im 87/101  soft-tissue]
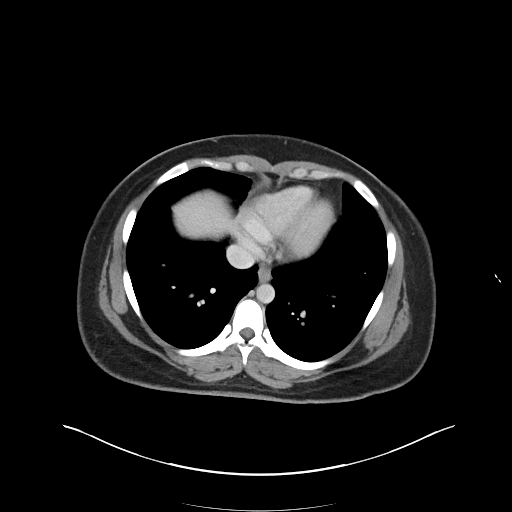
[im 96/101  soft-tissue]
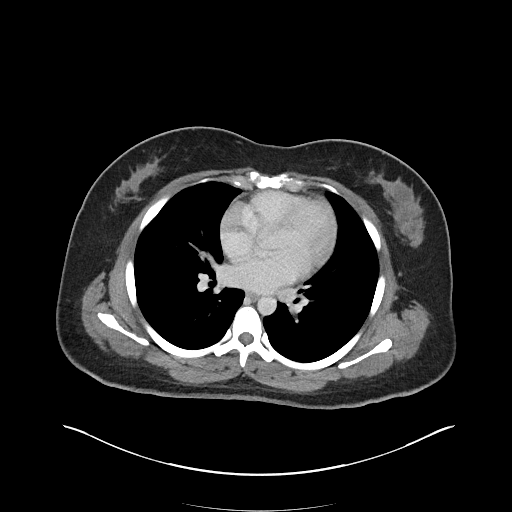

[Series 6: coronal soft tissue · coronal · 0.83mm/px · 3 of 101 slices shown]
[im 34/101  soft-tissue]
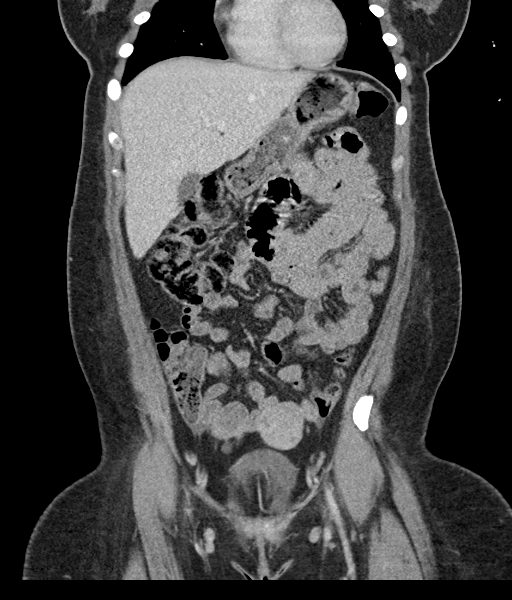
[im 45/101  soft-tissue]
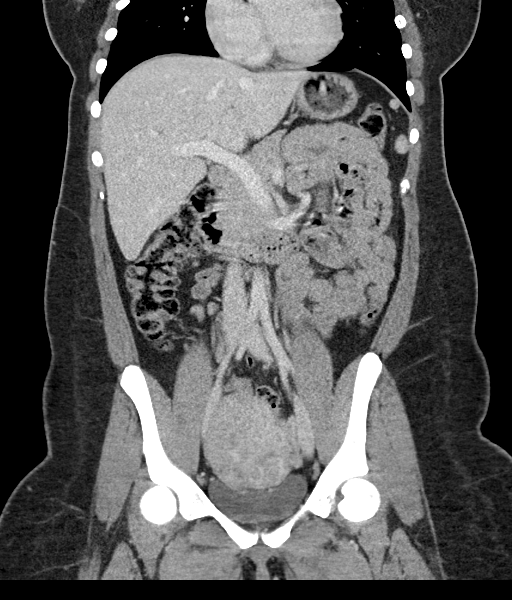
[im 56/101  soft-tissue]
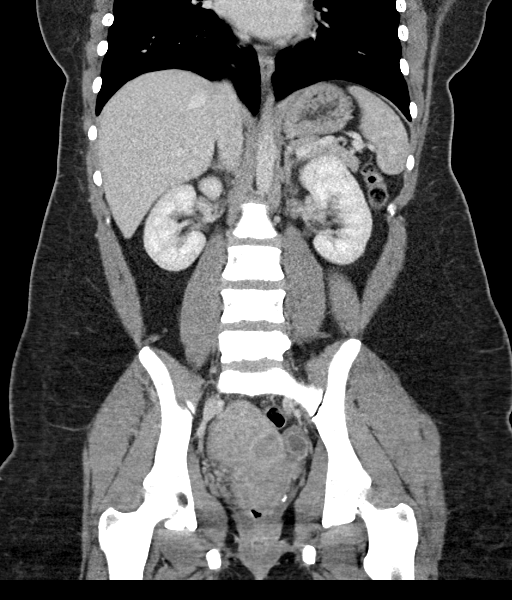

[15 of 46 positions shown; findings below may reference images not displayed]

FINDINGS: Lower chest: No acute process at the lung bases.

Hepatobiliary: No focal, suspicious hepatic lesion. No
pericholecystic stranding. No biliary duct dilation. Portal vein is
patent.

Pancreas: Normal, without mass, inflammation or ductal dilatation.

Spleen: Spleen normal size and contour.

Adrenals/Urinary Tract: Adrenal glands are normal.

Symmetric renal enhancement. No hydronephrosis. No perinephric
stranding. Urinary bladder with smooth contours. Under distension of
the urinary bladder does limit assessment no perivesical stranding.
No suspicious renal lesion.

Nephrolithiasis in the lower pole of the LEFT kidney.

Stomach/Bowel: Normal appendix. No sign of small bowel obstruction
or adjacent stranding. Stomach grossly unremarkable.

Colon without signs of adjacent inflammation. Moderate stool
throughout the colon.

Vascular/Lymphatic: Patent portal vein. Normal caliber of the
abdominal aorta with smooth contour of the IVC. There is no
gastrohepatic or hepatoduodenal ligament lymphadenopathy. No
retroperitoneal or mesenteric lymphadenopathy.

No pelvic sidewall lymphadenopathy.

Reproductive: Bulky lobulated appearance of the uterus. Partially
calcified leiomyoma arising from the RIGHT uterine fundus measuring
approximately 6.9 x 5.5 cm. Another ovoid lesion with similar
enhancement pattern but without calcification extends towards the
LEFT adnexa 4.3 x 3.5 cm. This appears to be separate from the LEFT
ovary which shows a corpus luteum. RIGHT ovary likely above RIGHT
uterine fundus but not well evaluated on CT. Other small more
heterogeneous areas in the uterus likely small leiomyomata along the
lower uterine segment.

Other: Small amount of pelvic fluid in the cul-de-sac in the range
of would be expected for physiologic fluid measuring simple fluid
density.

Musculoskeletal: No acute bone finding or destructive bone process.
IMPRESSION: 1. No acute findings in the abdomen or pelvis.
2. Leiomyomata about the uterus largest on the RIGHT extending
towards RIGHT adnexa.
3. Other small more heterogeneous areas in the uterus likely small
leiomyomata along the lower uterine segment. Correlate with any
pelvic symptoms with follow-up sonography as warranted.
4. Nephrolithiasis.
5. Moderate stool throughout the colon.
6. Normal appendix.

## 2023-08-09 ENCOUNTER — Encounter: Payer: Self-pay | Admitting: Nurse Practitioner

## 2023-08-09 ENCOUNTER — Ambulatory Visit (INDEPENDENT_AMBULATORY_CARE_PROVIDER_SITE_OTHER): Payer: 59 | Admitting: Nurse Practitioner

## 2023-08-09 VITALS — BP 114/74 | HR 94 | Temp 98.2°F | Ht 63.0 in | Wt 226.2 lb

## 2023-08-09 DIAGNOSIS — M545 Low back pain, unspecified: Secondary | ICD-10-CM | POA: Insufficient documentation

## 2023-08-09 LAB — COMPREHENSIVE METABOLIC PANEL
ALT: 17 U/L (ref 0–35)
AST: 21 U/L (ref 0–37)
Albumin: 4.2 g/dL (ref 3.5–5.2)
Alkaline Phosphatase: 66 U/L (ref 39–117)
BUN: 7 mg/dL (ref 6–23)
CO2: 29 mEq/L (ref 19–32)
Calcium: 9.1 mg/dL (ref 8.4–10.5)
Chloride: 100 mEq/L (ref 96–112)
Creatinine, Ser: 0.68 mg/dL (ref 0.40–1.20)
GFR: 114.21 mL/min (ref >60.00)
Glucose, Bld: 95 mg/dL (ref 70–99)
Potassium: 3.7 mEq/L (ref 3.5–5.1)
Sodium: 136 mEq/L (ref 135–145)
Total Bilirubin: 0.4 mg/dL (ref 0.2–1.2)
Total Protein: 7.2 g/dL (ref 6.0–8.3)

## 2023-08-09 LAB — CBC
HCT: 41.6 % (ref 36.0–46.0)
Hemoglobin: 13.2 g/dL (ref 12.0–15.0)
MCHC: 31.7 g/dL (ref 30.0–36.0)
MCV: 86.2 fl (ref 78.0–100.0)
Platelets: 290 10*3/uL (ref 150.0–400.0)
RBC: 4.83 Mil/uL (ref 3.87–5.11)
RDW: 15.5 % (ref 11.5–15.5)
WBC: 7.9 10*3/uL (ref 4.0–10.5)

## 2023-08-09 MED ORDER — METHOCARBAMOL 500 MG PO TABS: 1000.0000 mg | ORAL_TABLET | Freq: Three times a day (TID) | ORAL | 0 refills | Status: AC | PRN

## 2023-08-09 MED ORDER — PREDNISONE 20 MG PO TABS
40.0000 mg | ORAL_TABLET | Freq: Every day | ORAL | 0 refills | Status: AC
Start: 2023-08-09 — End: ?

## 2023-08-09 NOTE — Assessment & Plan Note (Signed)
Acute Seems muscular in origin.  Symptoms are stable, no red flags. For now we will continue to manage symptoms with as needed methocarbamol, Tylenol, and will place on short course of steroids.  We did discuss risks and benefits of steroid treatment especially as she is taken in the past for treatment of asthma and overuse or overexposure can have negative long-term effects.  However patient would like to trial steroids due to severity of her pain.  I think this is reasonable.  She was told to not take any NSAIDs while taking prednisone and to make sure she takes prednisone with food.  She also be referred to physical therapy. Follow-up in 3 weeks with primary care provider for close monitoring, if back pain persist I would recommend either to consider imaging at that time or refer to specialist..

## 2023-08-09 NOTE — Progress Notes (Signed)
Established Patient Office Visit  Subjective   Patient ID: Carly Perez, female    DOB: 1991/08/02  Age: 32 y.o. MRN: [id number]  Chief Complaint  Patient presents with   Back Pain    Has back pain that has been occurring for approximately 3 weeks.  Patient is a CNA.  Was seen in the emergency department approximately 2 weeks ago.  Evaluation was reassuring and patient was discharged on meloxicam and methocarbamol.  Since then pain persists, it is intermittent, gets as bad as 10 out of 10 in intensity.  Patient reports that the methocarbamol does relieve pain, however patient is taking this sparingly.  Aggravating factors include standing, sitting.  Has used IcyHot and Tylenol without improvement.  Denies any trauma prior to back pain onset.  No personal history of malignancy.  No new numbness or sensory changes (has had known intermittent numbness to upper extremities but this is remained unchanged with onset of back pain), no changes to bowel or bladder habits.    Review of Systems  Constitutional:  Negative for chills, fever and malaise/fatigue.  Cardiovascular:  Negative for chest pain.  Gastrointestinal:  Negative for blood in stool, nausea and vomiting.       (-) fecal incontinence  Genitourinary:  Negative for hematuria.       (-) urinary incontinence  Neurological:  Positive for sensory change. Negative for weakness.      Objective:     BP 114/74   Pulse 94   Temp 98.2 F (36.8 C) (Temporal)   Ht 5\' 3"  (1.6 m)   Wt 226 lb 4 oz (102.6 kg)   SpO2 97%   BMI 40.08 kg/m    Physical Exam Vitals reviewed.  Constitutional:      General: She is not in acute distress.    Appearance: Normal appearance.  HENT:     Head: Normocephalic and atraumatic.  Neck:     Vascular: No carotid bruit.  Cardiovascular:     Rate and Rhythm: Normal rate and regular rhythm.     Pulses: Normal pulses.     Heart sounds: Normal heart sounds.  Pulmonary:     Effort:  Pulmonary effort is normal.     Breath sounds: Normal breath sounds.  Musculoskeletal:     Lumbar back: Tenderness present. No deformity. Negative right straight leg raise test and negative left straight leg raise test.  Skin:    General: Skin is warm and dry.  Neurological:     General: No focal deficit present.     Mental Status: She is alert and oriented to person, place, and time.  Psychiatric:        Mood and Affect: Mood normal.        Behavior: Behavior normal.        Judgment: Judgment normal.      No results found for any visits on 08/09/23.    The ASCVD Risk score (Arnett DK, et al., 2019) failed to calculate for the following reasons:   The 2019 ASCVD risk score is only valid for ages 15 to 71    Assessment & Plan:   Problem List Items Addressed This Visit       Other   Low back pain - Primary    Acute Seems muscular in origin.  Symptoms are stable, no red flags. For now we will continue to manage symptoms with as needed methocarbamol, Tylenol, and will place on short course of steroids.  We did discuss risks  and benefits of steroid treatment especially as she is taken in the past for treatment of asthma and overuse or overexposure can have negative long-term effects.  However patient would like to trial steroids due to severity of her pain.  I think this is reasonable.  She was told to not take any NSAIDs while taking prednisone and to make sure she takes prednisone with food.  She also be referred to physical therapy. Follow-up in 3 weeks with primary care provider for close monitoring, if back pain persist I would recommend either to consider imaging at that time or refer to specialist..      Relevant Medications   predniSONE (DELTASONE) 20 MG tablet   methocarbamol (ROBAXIN) 500 MG tablet   Other Relevant Orders   Ambulatory referral to Physical Therapy   CBC   Comprehensive metabolic panel    Return in about 3 weeks (around 08/30/2023) for back pain with  [name].    Carly Paddy, NP

## 2023-08-27 ENCOUNTER — Ambulatory Visit: Payer: 59

## 2023-08-30 ENCOUNTER — Ambulatory Visit: Payer: 59 | Admitting: Family Medicine

## 2023-09-10 ENCOUNTER — Encounter: Payer: Self-pay | Admitting: Family Medicine

## 2023-09-10 ENCOUNTER — Ambulatory Visit: Payer: 59 | Admitting: Family Medicine

## 2023-09-18 DIAGNOSIS — F17210 Nicotine dependence, cigarettes, uncomplicated: Secondary | ICD-10-CM | POA: Diagnosis not present

## 2023-09-18 DIAGNOSIS — F319 Bipolar disorder, unspecified: Secondary | ICD-10-CM | POA: Diagnosis not present

## 2023-09-18 DIAGNOSIS — G43909 Migraine, unspecified, not intractable, without status migrainosus: Secondary | ICD-10-CM | POA: Diagnosis not present

## 2023-09-18 DIAGNOSIS — Z833 Family history of diabetes mellitus: Secondary | ICD-10-CM | POA: Diagnosis not present

## 2023-09-18 DIAGNOSIS — F419 Anxiety disorder, unspecified: Secondary | ICD-10-CM | POA: Diagnosis not present

## 2023-09-18 DIAGNOSIS — M545 Low back pain, unspecified: Secondary | ICD-10-CM | POA: Diagnosis not present

## 2023-09-18 DIAGNOSIS — J45909 Unspecified asthma, uncomplicated: Secondary | ICD-10-CM | POA: Diagnosis not present

## 2023-09-18 DIAGNOSIS — Z809 Family history of malignant neoplasm, unspecified: Secondary | ICD-10-CM | POA: Diagnosis not present

## 2023-09-18 DIAGNOSIS — Z79890 Hormone replacement therapy (postmenopausal): Secondary | ICD-10-CM | POA: Diagnosis not present

## 2023-09-18 DIAGNOSIS — K219 Gastro-esophageal reflux disease without esophagitis: Secondary | ICD-10-CM | POA: Diagnosis not present

## 2023-09-18 DIAGNOSIS — Z7951 Long term (current) use of inhaled steroids: Secondary | ICD-10-CM | POA: Diagnosis not present

## 2023-10-29 NOTE — Progress Notes (Deleted)
No chief complaint on file.   HISTORY OF PRESENT ILLNESS:  10/29/23 ALL:  Carly Perez is a 32 y.o. female here today for follow up for migraines.  He was seen in consult with Dr Marjory Lies 04/2023. Topiramate 50mg  BID continued and Nurtec PRN added for abortive therapy. Since,    HISTORY (copied from Penumalli's previous note)  32 year old female here for evaluation of headaches.   Patient has had headaches since teenage years.  Headaches consist of pain in the back of her head rating to her eyes.  Headaches last hours to days at a time.  Headaches associated with throbbing sensation, nausea and photophobia.  No specific triggering factors.  She was having 1-2 headaches per month but have increased to almost daily basis for the past 3 months.  She averages about 5 to 6 hours of sleep per night.  Was tried on topiramate and sumatriptan without relief.   REVIEW OF SYSTEMS: Out of a complete 14 system review of symptoms, the patient complains only of the following symptoms, and all other reviewed systems are negative.   ALLERGIES: Allergies  Allergen Reactions   Cat Hair Extract Hives   Dog Epithelium (Canis Lupus Familiaris) Hives   Grass Pollen(K-O-R-T-Swt Vern) Other (See Comments) and Hives   Other Hives and Other (See Comments)    Dust mites   Shellfish-Derived Products Hives     HOME MEDICATIONS: Outpatient Medications Prior to Visit  Medication Sig Dispense Refill   albuterol (PROVENTIL) (2.5 MG/3ML) 0.083% nebulizer solution albuterol sulfate 2.5 mg/3 mL (0.083 %) solution for nebulization 75 mL 0   albuterol (VENTOLIN HFA) 108 (90 Base) MCG/ACT inhaler Inhale 1-2 puffs into the lungs every 4 (four) hours as needed. 8 g 1   budesonide-formoterol (SYMBICORT) 160-4.5 MCG/ACT inhaler Inhale 2 puffs into the lungs 2 (two) times daily. 1 each 3   escitalopram (LEXAPRO) 10 MG tablet TAKE 1 TABLET BY MOUTH EVERY DAY 90 tablet 0   meloxicam (MOBIC) 7.5 MG tablet  Take 1 tablet (7.5 mg total) by mouth daily. 10 tablet 0   methocarbamol (ROBAXIN) 500 MG tablet Take 2 tablets (1,000 mg total) by mouth every 8 (eight) hours as needed for muscle spasms. 30 tablet 0   montelukast (SINGULAIR) 10 MG tablet Take 1 tablet (10 mg total) by mouth at bedtime. 30 tablet 3   ondansetron (ZOFRAN) 4 MG tablet Take 1 tablet (4 mg total) by mouth every 8 (eight) hours as needed for nausea or vomiting. 5 tablet 0   pantoprazole (PROTONIX) 40 MG tablet Take 1 tablet (40 mg total) by mouth daily. 30 tablet 3   predniSONE (DELTASONE) 20 MG tablet Take 2 tablets (40 mg total) by mouth daily with breakfast. 10 tablet 0   promethazine (PHENERGAN) 12.5 MG tablet Take 1 tablet (12.5 mg total) by mouth every 6 (six) hours as needed (headache). 10 tablet 0   Rimegepant Sulfate (NURTEC) 75 MG TBDP Take 1 tablet (75 mg total) by mouth daily as needed. 8 tablet 6   topiramate (TOPAMAX) 50 MG tablet Take 1 tablet (50 mg total) by mouth 2 (two) times daily. 30 tablet 2   No facility-administered medications prior to visit.     PAST MEDICAL HISTORY: Past Medical History:  Diagnosis Date   Asthma    Depression 03/15/2023   Environmental and seasonal allergies 03/15/2023   Gastroesophageal reflux disease 03/15/2023   Gout    Sleep apnea      PAST SURGICAL HISTORY: Past  Surgical History:  Procedure Laterality Date   CYST EXCISION     TONSILLECTOMY       FAMILY HISTORY: Family History  Problem Relation Age of Onset   ADD / ADHD Mother    Asthma Father    Depression Father      SOCIAL HISTORY: Social History   Socioeconomic History   Marital status: Single    Spouse name: Not on file   Number of children: Not on file   Years of education: Not on file   Highest education level: 12th grade  Occupational History   Not on file  Tobacco Use   Smoking status: Never   Smokeless tobacco: Never  Vaping Use   Vaping status: Never Used  Substance and Sexual Activity    Alcohol use: Never   Drug use: Never   Sexual activity: Never  Other Topics Concern   Not on file  Social History Narrative   Not on file   Social Determinants of Health   Financial Resource Strain: Low Risk  (04/08/2023)   Overall Financial Resource Strain (CARDIA)    Difficulty of Paying Living Expenses: Not hard at all  Food Insecurity: No Food Insecurity (07/29/2023)   Hunger Vital Sign    Worried About Running Out of Food in the Last Year: Never true    Ran Out of Food in the Last Year: Never true  Transportation Needs: No Transportation Needs (07/29/2023)   PRAPARE - Administrator, Civil Service (Medical): No    Lack of Transportation (Non-Medical): No  Physical Activity: Unknown (04/08/2023)   Exercise Vital Sign    Days of Exercise per Week: 0 days    Minutes of Exercise per Session: Not on file  Stress: Stress Concern Present (04/08/2023)   [employer] of Occupational Health - Occupational Stress Questionnaire    Feeling of Stress : Very much  Social Connections: Socially Isolated (04/08/2023)   Social Connection and Isolation Panel [NHANES]    Frequency of Communication with Friends and Family: More than three times a week    Frequency of Social Gatherings with Friends and Family: Once a week    Attends Religious Services: Never    Database administrator or Organizations: No    Attends Engineer, structural: Not on file    Marital Status: Never married  Intimate Partner Violence: Not on file     PHYSICAL EXAM  There were no vitals filed for this visit. There is no height or weight on file to calculate BMI.  Generalized: Well developed, in no acute distress  Cardiology: normal rate and rhythm, no murmur auscultated  Respiratory: clear to auscultation bilaterally    Neurological examination  Mentation: Alert oriented to time, place, history taking. Follows all commands speech and language fluent Cranial nerve II-XII: Pupils were equal round  reactive to light. Extraocular movements were full, visual field were full on confrontational test. Facial sensation and strength were normal. Uvula tongue midline. Head turning and shoulder shrug  were normal and symmetric. Motor: The motor testing reveals 5 over 5 strength of all 4 extremities. Good symmetric motor tone is noted throughout.  Sensory: Sensory testing is intact to soft touch on all 4 extremities. No evidence of extinction is noted.  Coordination: Cerebellar testing reveals good finger-nose-finger and heel-to-shin bilaterally.  Gait and station: Gait is normal. Tandem gait is normal. Romberg is negative. No drift is seen.  Reflexes: Deep tendon reflexes are symmetric and normal bilaterally.  DIAGNOSTIC DATA (LABS, IMAGING, TESTING) - I reviewed patient records, labs, notes, testing and imaging myself where available.  Lab Results  Component Value Date   WBC 7.9 08/09/2023   HGB 13.2 08/09/2023   HCT 41.6 08/09/2023   MCV 86.2 08/09/2023   PLT 290.0 08/09/2023      Component Value Date/Time   NA 136 08/09/2023 1609   K 3.7 08/09/2023 1609   CL 100 08/09/2023 1609   CO2 29 08/09/2023 1609   GLUCOSE 95 08/09/2023 1609   BUN 7 08/09/2023 1609   CREATININE 0.68 08/09/2023 1609   CALCIUM 9.1 08/09/2023 1609   PROT 7.2 08/09/2023 1609   ALBUMIN 4.2 08/09/2023 1609   AST 21 08/09/2023 1609   ALT 17 08/09/2023 1609   ALKPHOS 66 08/09/2023 1609   BILITOT 0.4 08/09/2023 1609   GFRNONAA >60 02/04/2023 1107   GFRAA >60 08/10/2020 2240   No results found for: "CHOL", "HDL", "LDLCALC", "LDLDIRECT", "TRIG", "CHOLHDL" No results found for: "HGBA1C" Lab Results  Component Value Date   VITAMINB12 871 04/25/2023   Lab Results  Component Value Date   TSH 0.736 04/25/2023        No data to display               No data to display           ASSESSMENT AND PLAN  32 y.o. year old female  has a past medical history of Asthma, Depression (03/15/2023),  Environmental and seasonal allergies (03/15/2023), Gastroesophageal reflux disease (03/15/2023), Gout, and Sleep apnea. here with    No diagnosis found.  Samari Lynwood Dawley ***.  Healthy lifestyle habits encouraged. *** will follow up with PCP as directed. *** will return to see me in ***, sooner if needed. *** verbalizes understanding and agreement with this plan.   No orders of the defined types were placed in this encounter.    No orders of the defined types were placed in this encounter.    Shawnie Dapper, MSN, FNP-C 10/29/2023, 12:53 PM  [facility] [address], [address] [place], [place] [address] [phone]

## 2023-10-30 ENCOUNTER — Encounter: Payer: Self-pay | Admitting: Family Medicine

## 2023-10-30 ENCOUNTER — Ambulatory Visit: Payer: 59 | Admitting: Family Medicine

## 2023-10-30 DIAGNOSIS — G43711 Chronic migraine without aura, intractable, with status migrainosus: Secondary | ICD-10-CM

## 2023-11-05 DIAGNOSIS — F649 Gender identity disorder, unspecified: Secondary | ICD-10-CM | POA: Diagnosis not present

## 2023-11-15 DIAGNOSIS — F649 Gender identity disorder, unspecified: Secondary | ICD-10-CM | POA: Diagnosis not present

## 2023-12-28 IMAGING — US US PELVIS COMPLETE
1 series · 15 of 25 positions shown · non-contrast
Comparison: 08/15/2021 CT

CLINICAL DATA: Pelvic pain

EXAM:
TRANSABDOMINAL AND TRANSVAGINAL ULTRASOUND OF PELVIS
DOPPLER ULTRASOUND OF OVARIES
TECHNIQUE: Both transabdominal and transvaginal ultrasound examinations of the
pelvis were performed. Transabdominal technique was performed for
global imaging of the pelvis including uterus, ovaries, adnexal
regions, and pelvic cul-de-sac.
It was necessary to proceed with endovaginal exam following the
transabdominal exam to visualize the uterus, endometrium, ovaries
and adnexa. Color and duplex Doppler ultrasound was utilized to
evaluate blood flow to the ovaries.

[Series 1: us pelvis complete mc & wl · 15 of 78 slices shown]
[im 1/78]
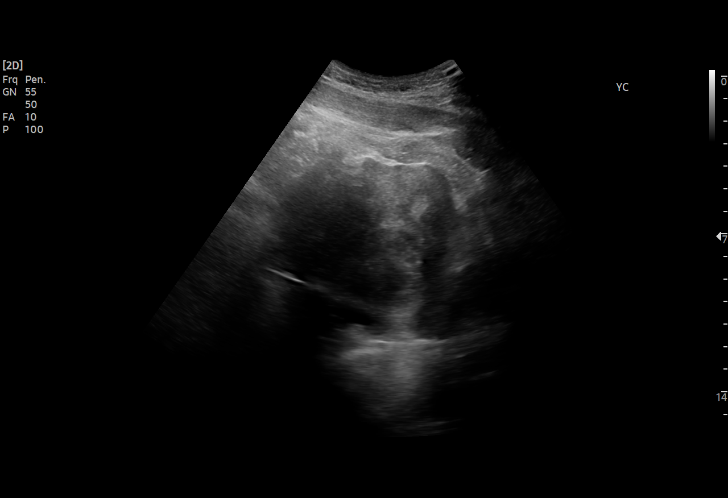
[im 7/78]
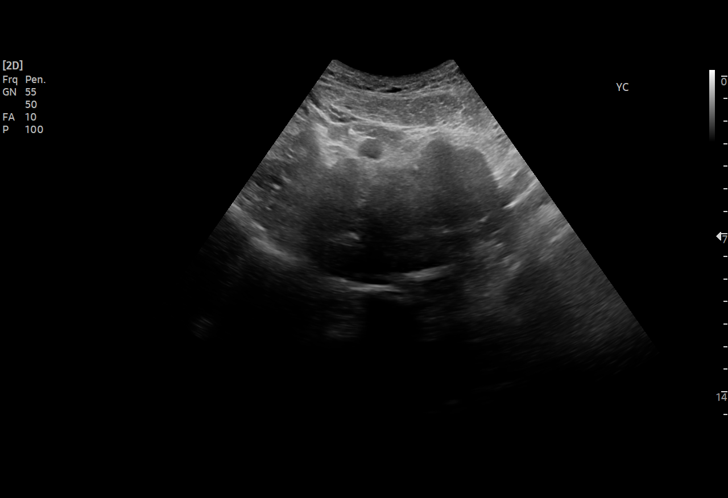
[im 13/78]
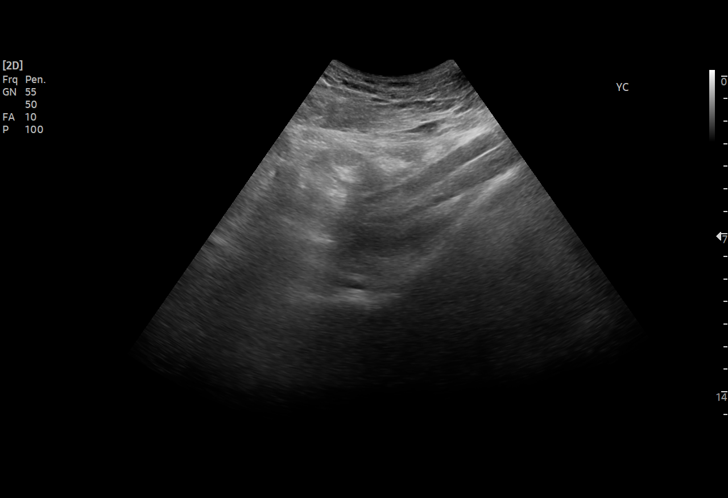
[im 17/78]
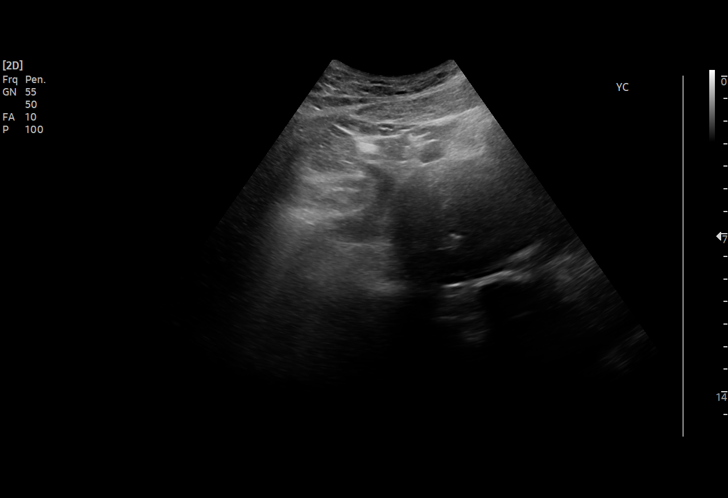
[im 23/78]
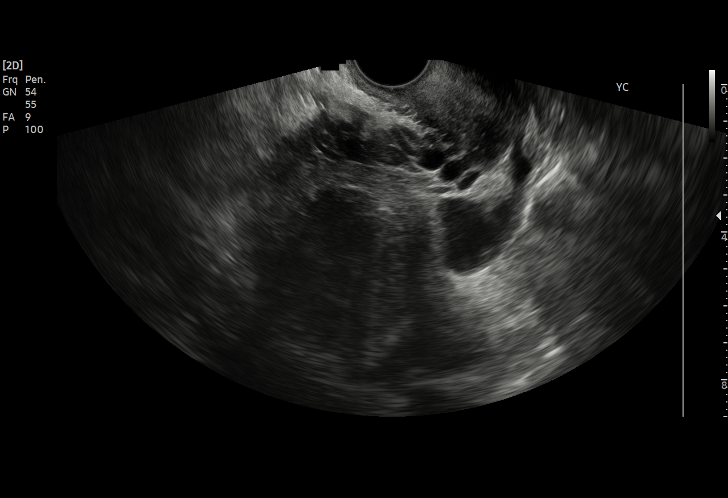
[im 29/78]
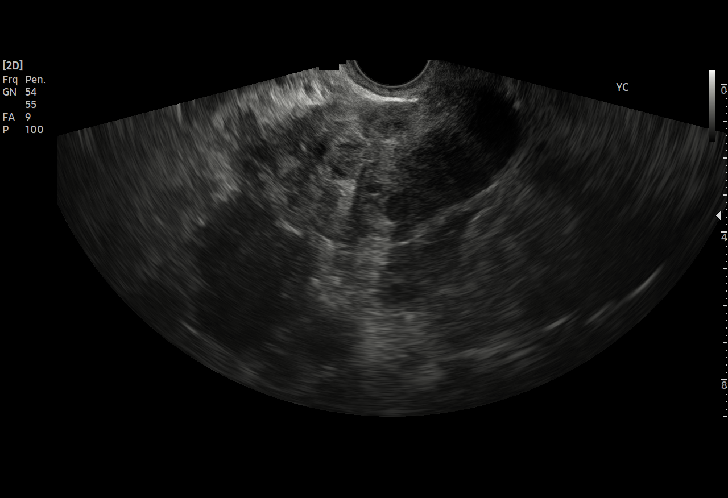
[im 33/78]
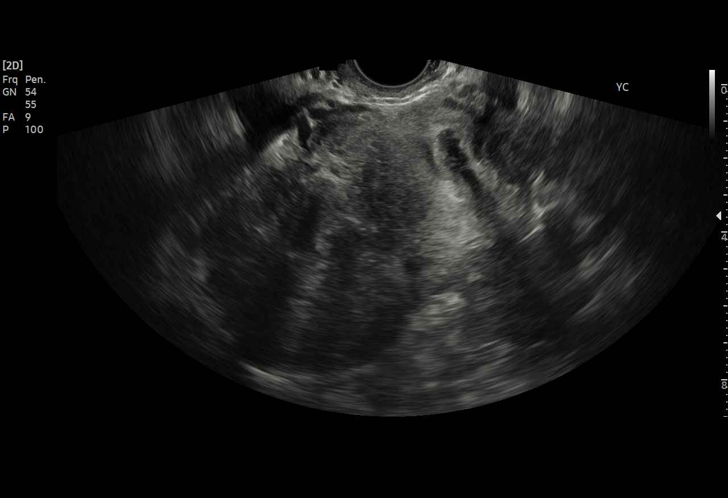
[im 39/78]
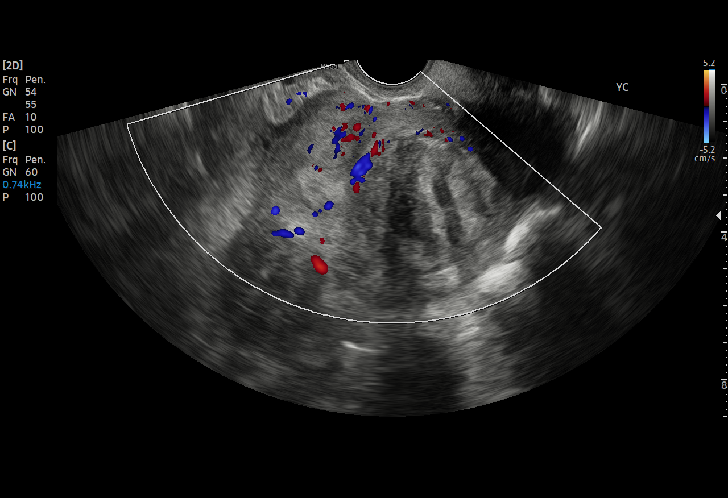
[im 45/78]
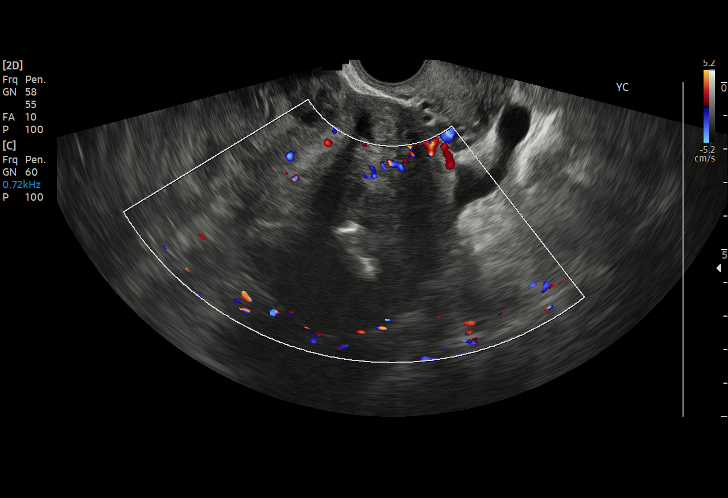
[im 49/78]
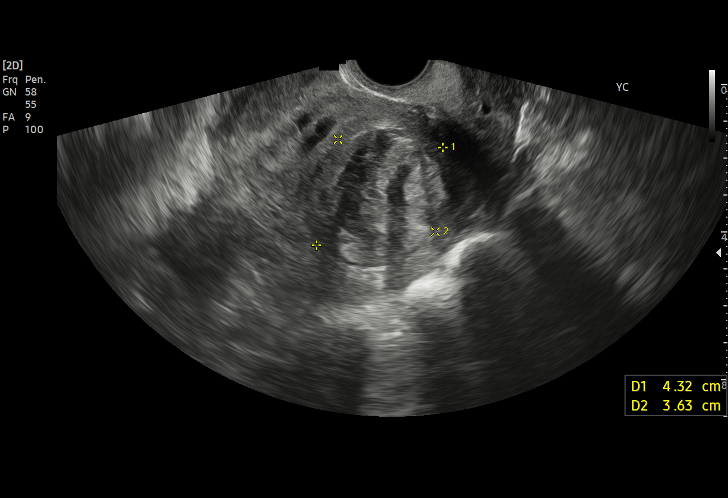
[im 55/78]
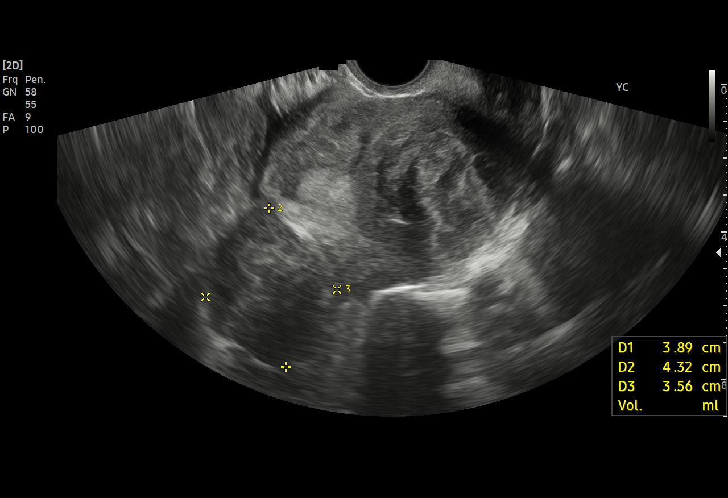
[im 61/78]
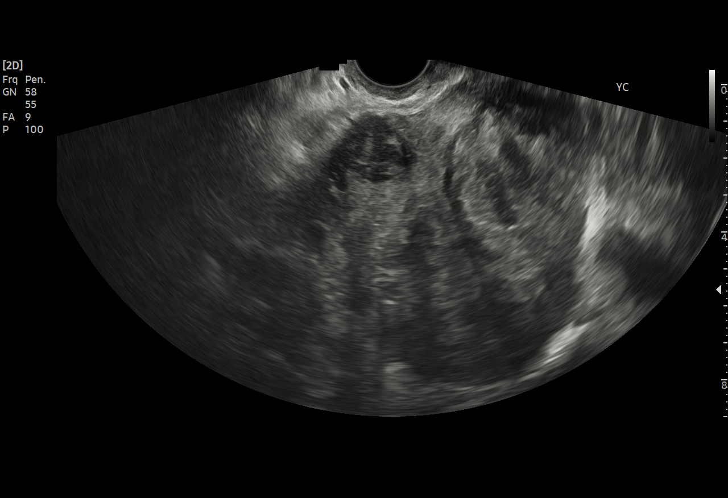
[im 65/78]
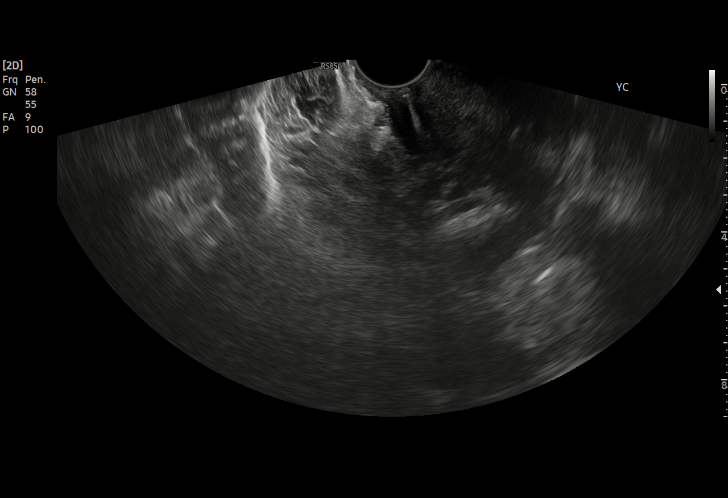
[im 71/78]
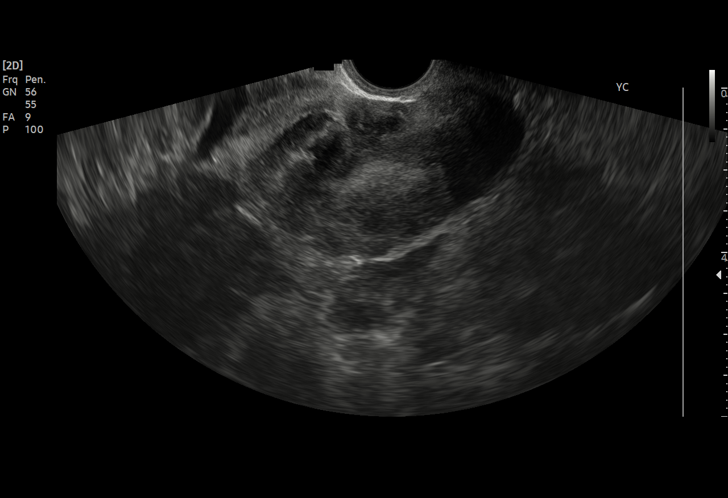
[im 78/78]
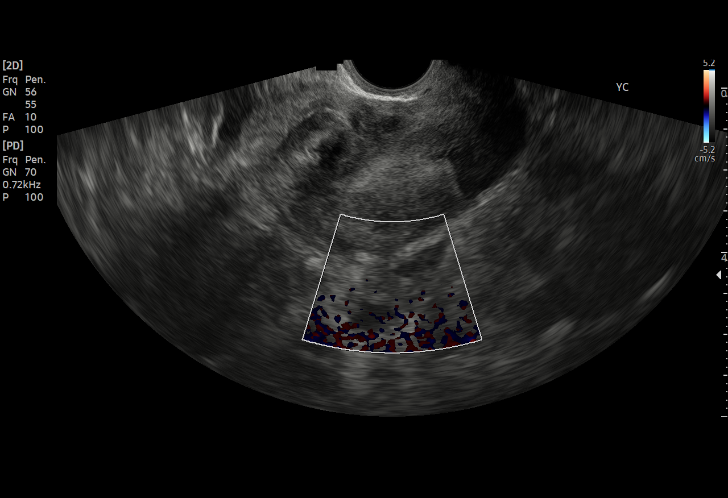

[15 of 25 positions shown; findings below may reference images not displayed]

FINDINGS: Uterus

Measurements: 10.3 x 7.6 x 8.8 cm = volume: 360 mL. Central 4.3 cm
fibroid. Posterior right fundal fibroid measuring up to 5.5 cm.
Anterior left fundal fibroid measuring up to 4.2 cm.

Endometrium

Thickness: 11 mm in thickness where visualized. No focal abnormality
visualized.

Right ovary

Measurements: Not visualized.  No adnexal mass seen.

Left ovary

Measurements: 3.0 x 1.6 x 1.9 cm = volume: 4.7 mL. Limited
visualization. No visible adnexal mass.

Pulsed Doppler evaluation of both ovaries demonstrates normal
low-resistance arterial and venous waveforms.

Other findings

No abnormal free fluid.
IMPRESSION: Enlarged fibroid uterus.

No acute findings.

## 2023-12-28 IMAGING — US US TRANSVAGINAL NON-OB
1 series · 15 of 25 positions shown · non-contrast
Comparison: 08/15/2021 CT

CLINICAL DATA: Pelvic pain

EXAM:
TRANSABDOMINAL AND TRANSVAGINAL ULTRASOUND OF PELVIS
DOPPLER ULTRASOUND OF OVARIES
TECHNIQUE: Both transabdominal and transvaginal ultrasound examinations of the
pelvis were performed. Transabdominal technique was performed for
global imaging of the pelvis including uterus, ovaries, adnexal
regions, and pelvic cul-de-sac.
It was necessary to proceed with endovaginal exam following the
transabdominal exam to visualize the uterus, endometrium, ovaries
and adnexa. Color and duplex Doppler ultrasound was utilized to
evaluate blood flow to the ovaries.

[Series 1: us pelvis complete mc & wl · 15 of 78 slices shown]
[im 1/78]
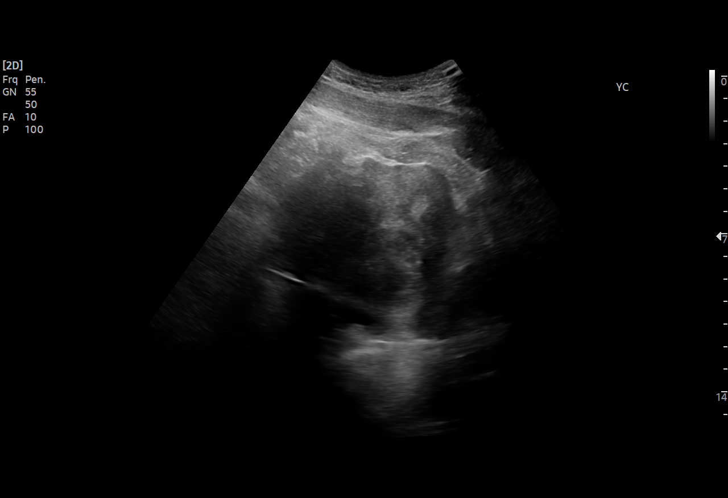
[im 7/78]
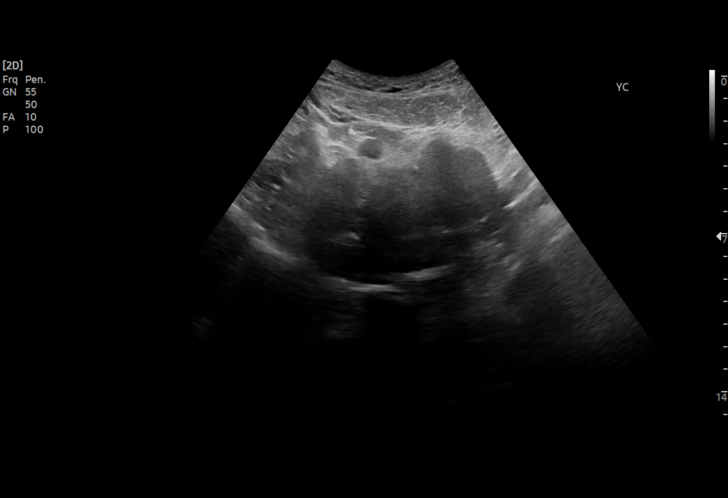
[im 13/78]
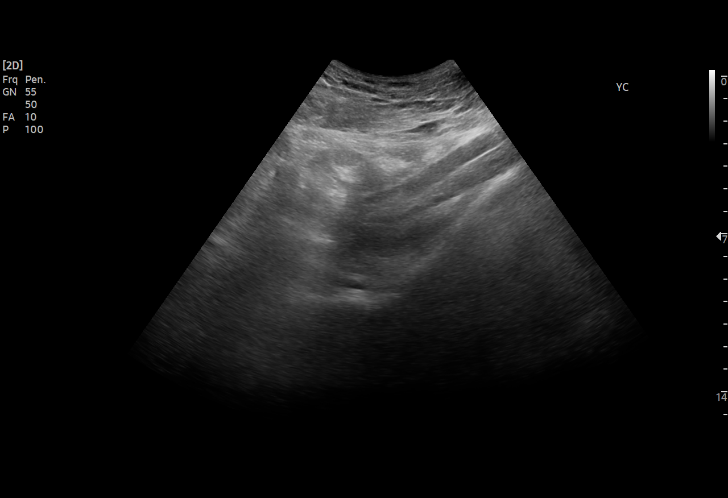
[im 17/78]
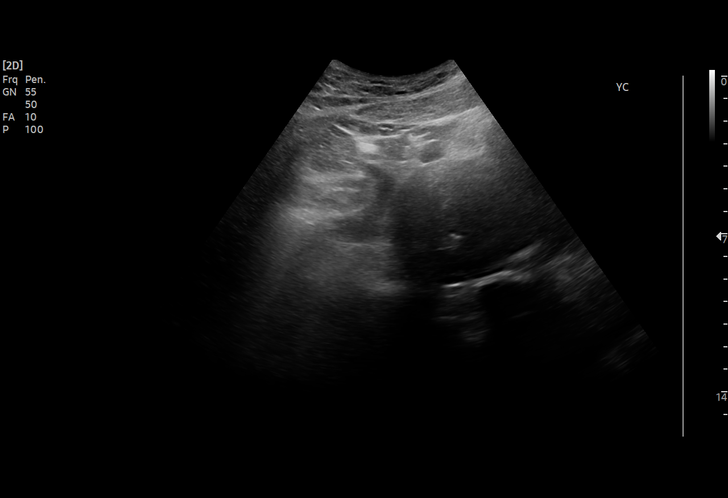
[im 23/78]
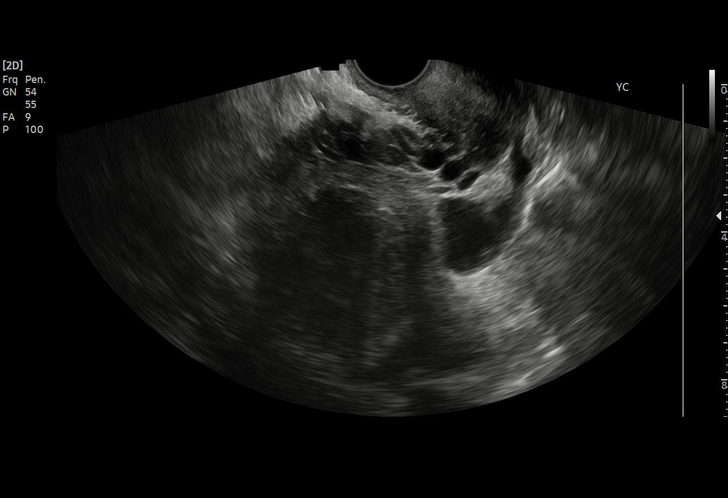
[im 29/78]
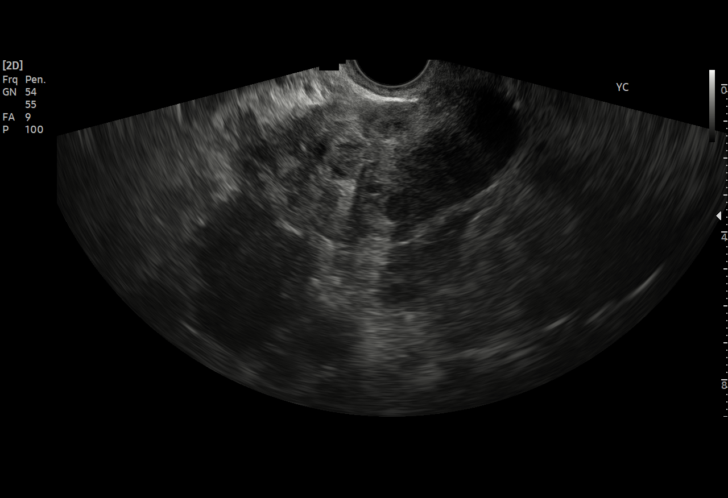
[im 33/78]
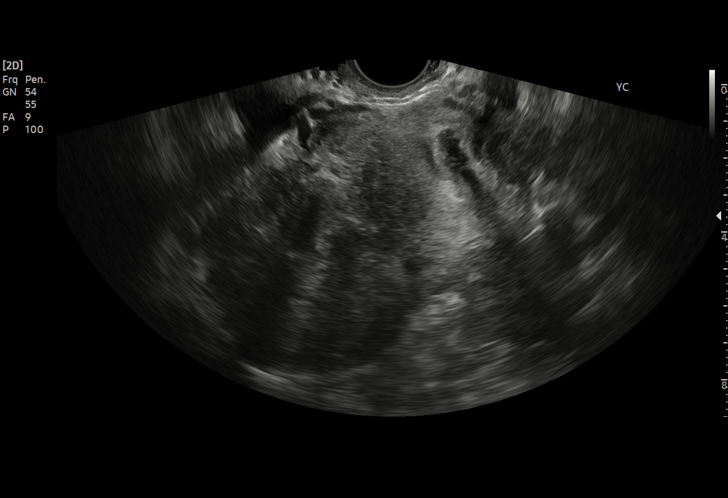
[im 39/78]
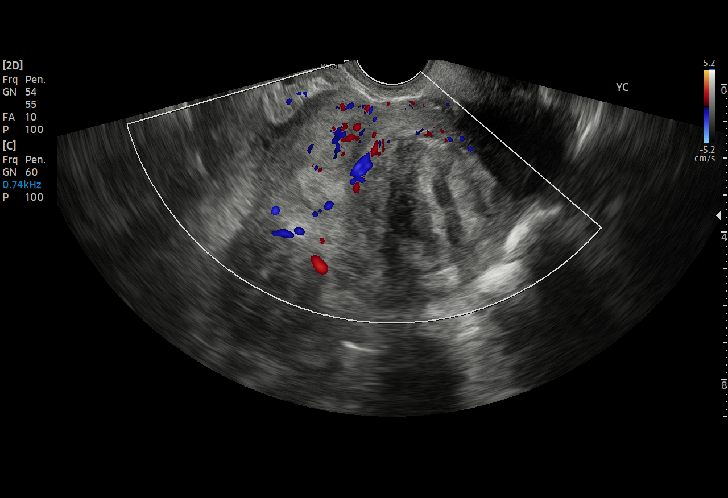
[im 45/78]
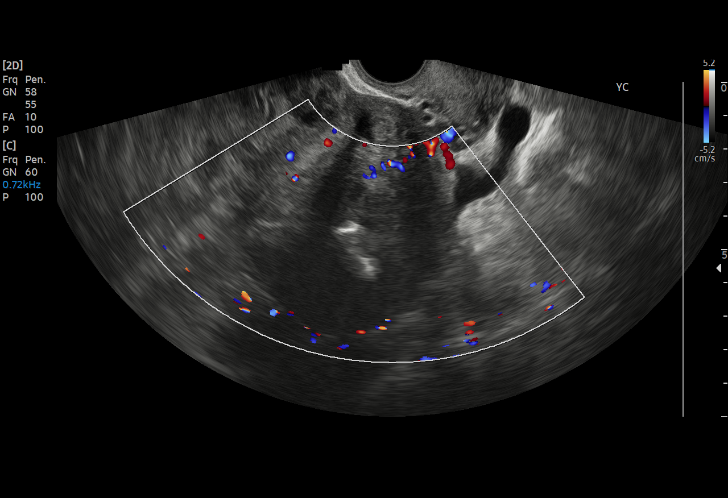
[im 49/78]
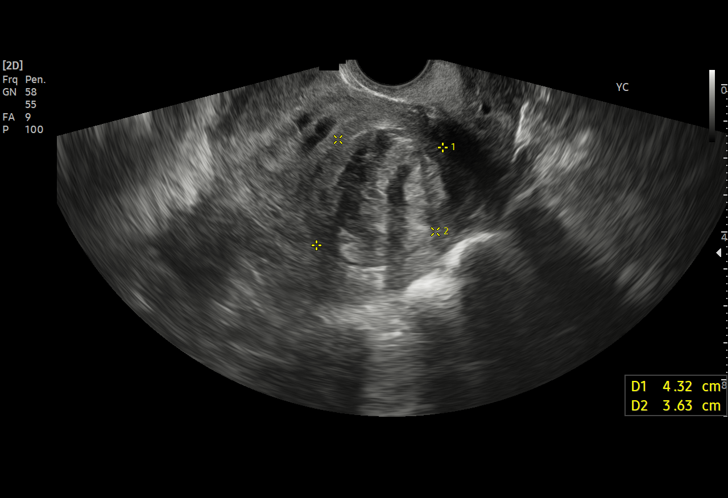
[im 55/78]
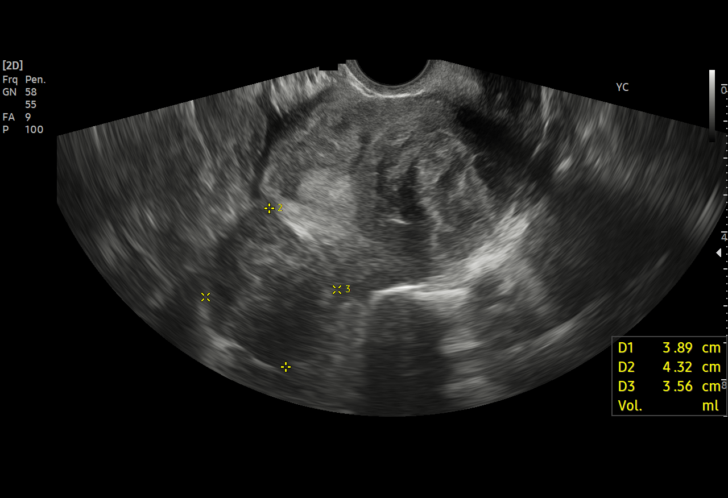
[im 61/78]
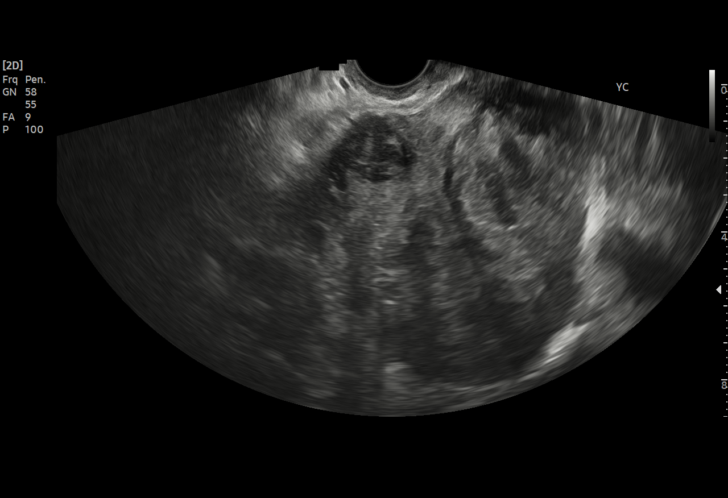
[im 65/78]
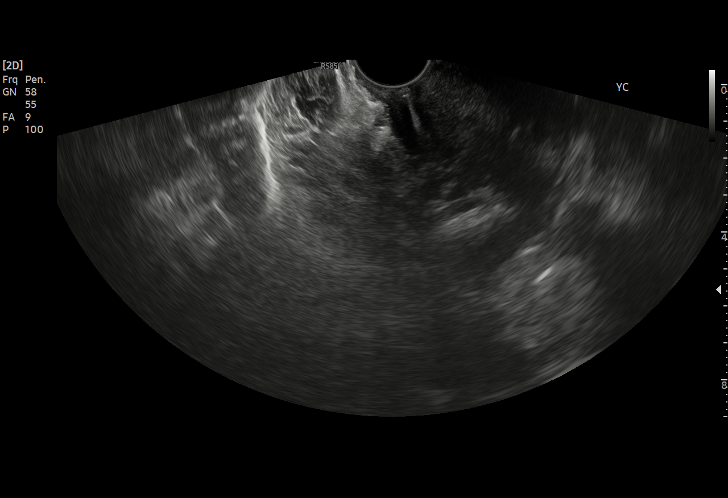
[im 71/78]
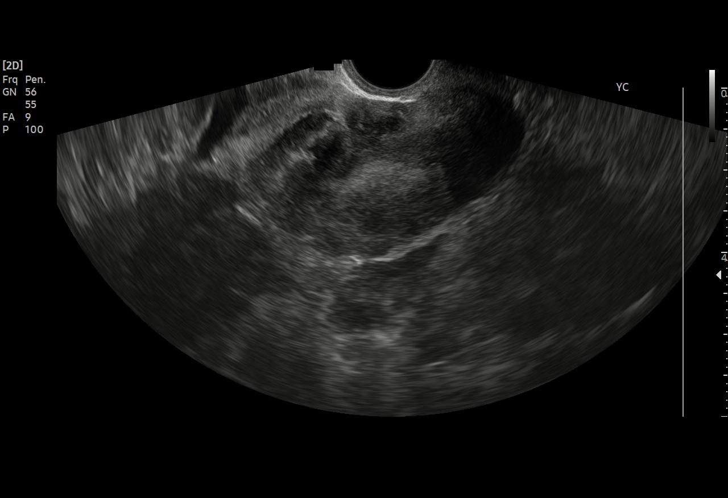
[im 78/78]
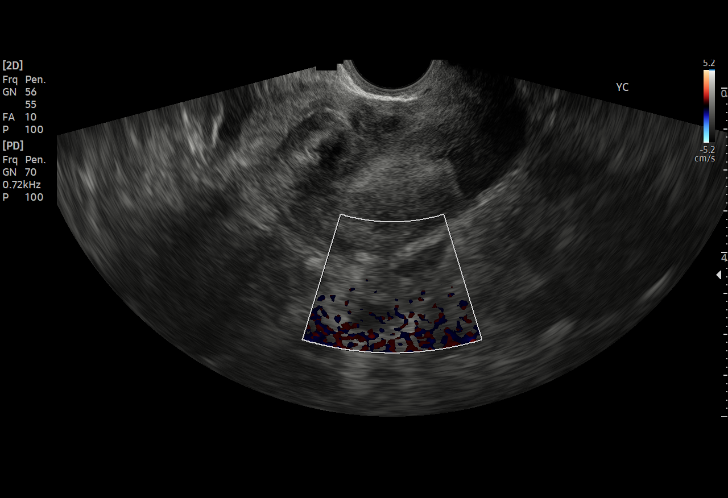

[15 of 25 positions shown; findings below may reference images not displayed]

FINDINGS: Uterus

Measurements: 10.3 x 7.6 x 8.8 cm = volume: 360 mL. Central 4.3 cm
fibroid. Posterior right fundal fibroid measuring up to 5.5 cm.
Anterior left fundal fibroid measuring up to 4.2 cm.

Endometrium

Thickness: 11 mm in thickness where visualized. No focal abnormality
visualized.

Right ovary

Measurements: Not visualized.  No adnexal mass seen.

Left ovary

Measurements: 3.0 x 1.6 x 1.9 cm = volume: 4.7 mL. Limited
visualization. No visible adnexal mass.

Pulsed Doppler evaluation of both ovaries demonstrates normal
low-resistance arterial and venous waveforms.

Other findings

No abnormal free fluid.
IMPRESSION: Enlarged fibroid uterus.

No acute findings.
